# Patient Record
Sex: Female | Born: 1990 | Race: Black or African American | Hispanic: No | Marital: Single | Smoking: Never smoker
Health system: Southern US, Community
[De-identification: ages and names within clinical notes are randomized; demographics above are authoritative.]

## PROBLEM LIST (undated history)

## (undated) DIAGNOSIS — N760 Acute vaginitis: Secondary | ICD-10-CM

## (undated) DIAGNOSIS — B9689 Other specified bacterial agents as the cause of diseases classified elsewhere: Secondary | ICD-10-CM

## (undated) DIAGNOSIS — Z8619 Personal history of other infectious and parasitic diseases: Secondary | ICD-10-CM

## (undated) DIAGNOSIS — J302 Other seasonal allergic rhinitis: Secondary | ICD-10-CM

## (undated) HISTORY — PX: WISDOM TOOTH EXTRACTION: SHX21

---

## 2014-01-20 DIAGNOSIS — G43909 Migraine, unspecified, not intractable, without status migrainosus: Secondary | ICD-10-CM | POA: Insufficient documentation

## 2014-01-20 DIAGNOSIS — N63 Unspecified lump in unspecified breast: Secondary | ICD-10-CM | POA: Insufficient documentation

## 2014-01-20 DIAGNOSIS — R011 Cardiac murmur, unspecified: Secondary | ICD-10-CM | POA: Insufficient documentation

## 2014-06-13 DIAGNOSIS — O1490 Unspecified pre-eclampsia, unspecified trimester: Secondary | ICD-10-CM

## 2017-05-28 DIAGNOSIS — B009 Herpesviral infection, unspecified: Secondary | ICD-10-CM | POA: Insufficient documentation

## 2017-08-08 DIAGNOSIS — F32A Depression, unspecified: Secondary | ICD-10-CM | POA: Insufficient documentation

## 2017-08-08 DIAGNOSIS — R7303 Prediabetes: Secondary | ICD-10-CM | POA: Insufficient documentation

## 2017-08-08 DIAGNOSIS — F329 Major depressive disorder, single episode, unspecified: Secondary | ICD-10-CM | POA: Insufficient documentation

## 2018-03-06 DIAGNOSIS — M25512 Pain in left shoulder: Secondary | ICD-10-CM | POA: Insufficient documentation

## 2018-10-24 DIAGNOSIS — F3281 Premenstrual dysphoric disorder: Secondary | ICD-10-CM | POA: Insufficient documentation

## 2018-10-30 ENCOUNTER — Telehealth: Payer: Self-pay | Admitting: Obstetrics & Gynecology

## 2018-10-30 NOTE — Telephone Encounter (Signed)
[facility] referring for Birth control consult. Called and left voicemail for patient to call back to be schedule

## 2018-10-31 NOTE — Telephone Encounter (Signed)
Called and left voice mail for patient to call back to be schedule °

## 2018-11-14 ENCOUNTER — Encounter: Payer: Medicaid Other | Admitting: Maternal Newborn

## 2018-11-19 ENCOUNTER — Ambulatory Visit (INDEPENDENT_AMBULATORY_CARE_PROVIDER_SITE_OTHER): Payer: Medicaid Other | Admitting: Maternal Newborn

## 2018-11-19 ENCOUNTER — Encounter: Payer: Self-pay | Admitting: Maternal Newborn

## 2018-11-19 ENCOUNTER — Other Ambulatory Visit: Payer: Self-pay

## 2018-11-19 ENCOUNTER — Encounter: Payer: Self-pay | Admitting: Emergency Medicine

## 2018-11-19 ENCOUNTER — Ambulatory Visit
Admission: EM | Admit: 2018-11-19 | Discharge: 2018-11-19 | Disposition: A | Discharge status: Home / Self Care | Payer: Medicaid Other | Attending: Family Medicine | Admitting: Family Medicine

## 2018-11-19 VITALS — BP 110/70 | HR 68 | Ht 62.0 in | Wt 135.0 lb

## 2018-11-19 DIAGNOSIS — Z3009 Encounter for other general counseling and advice on contraception: Secondary | ICD-10-CM

## 2018-11-19 DIAGNOSIS — Z30430 Encounter for insertion of intrauterine contraceptive device: Secondary | ICD-10-CM | POA: Diagnosis not present

## 2018-11-19 DIAGNOSIS — J029 Acute pharyngitis, unspecified: Secondary | ICD-10-CM | POA: Diagnosis present

## 2018-11-19 DIAGNOSIS — Z3202 Encounter for pregnancy test, result negative: Secondary | ICD-10-CM

## 2018-11-19 HISTORY — DX: Other seasonal allergic rhinitis: J30.2

## 2018-11-19 LAB — RAPID STREP SCREEN (MED CTR MEBANE ONLY): Streptococcus, Group A Screen (Direct): NEGATIVE

## 2018-11-19 LAB — POCT URINE PREGNANCY: Preg Test, Ur: NEGATIVE

## 2018-11-19 MED ORDER — AMOXICILLIN 500 MG PO TABS: 500.0000 mg | ORAL_TABLET | Freq: Two times a day (BID) | ORAL | 0 refills | Status: DC

## 2018-11-19 NOTE — Discharge Instructions (Signed)
OTC Ibuprofen as needed for pain.  Antibiotic as prescribed while awaiting strep culture.  Take care  Dr. Lacinda Axon

## 2018-11-19 NOTE — Progress Notes (Signed)
Obstetrics & Gynecology Office Visit   Chief Complaint:  Chief Complaint  Patient presents with  . Referral    [facility] conference; wants to try to stop cycle d/t PMDD    History of Present Illness: Cathy Ortiz is here to start birth control that will lessen and hopefully stop her menstrual cycles because she has a history of PMDD. She has had a Liletta IUD in the past, but had regular menses with that device. She is currently not using birth control. She recently completed a regular cycke.  Review of Systems  Constitutional: Negative.   HENT: Negative.   Eyes: Negative.   Respiratory: Negative.   Cardiovascular: Negative.   Gastrointestinal: Negative.   Genitourinary:       Pain during intercourse and vaginal discharge  Musculoskeletal: Negative.   Skin: Negative.   Neurological: Negative.   Endo/Heme/Allergies: Positive for environmental allergies.       Hot flashes  Psychiatric/Behavioral: Negative.     Past Medical History:  History reviewed. No pertinent past medical history.  Past Surgical History:  Past Surgical History:  Procedure Laterality Date  . WISDOM TOOTH EXTRACTION  2016/2017   ALL FOUR    Gynecologic History: No LMP recorded.  Obstetric History: [id number]  Family History:  Family History  Problem Relation Age of Onset  . Cancer Maternal Grandmother 62       LUNG  . Breast cancer Paternal Grandmother 76    Social History:  Social History   Socioeconomic History  . Marital status: Single    Spouse name: Not on file  . Number of children: Not on file  . Years of education: Not on file  . Highest education level: Not on file  Occupational History  . Not on file  Social Needs  . Financial resource strain: Not on file  . Food insecurity    Worry: Not on file    Inability: Not on file  . Transportation needs    Medical: Not on file    Non-medical: Not on file  Tobacco Use  . Smoking status: Never Smoker  . Smokeless tobacco: Never Used   Substance and Sexual Activity  . Alcohol use: Never    Frequency: Never  . Drug use: Never  . Sexual activity: Yes    Birth control/protection: I.U.D.  Lifestyle  . Physical activity    Days per week: Not on file    Minutes per session: Not on file  . Stress: Not on file  Relationships  . Social Musician on phone: Not on file    Gets together: Not on file    Attends religious service: Not on file    Active member of club or organization: Not on file    Attends meetings of clubs or organizations: Not on file    Relationship status: Not on file  . Intimate partner violence    Fear of current or ex partner: Not on file    Emotionally abused: Not on file    Physically abused: Not on file    Forced sexual activity: Not on file  Other Topics Concern  . Not on file  Social History Narrative  . Not on file    Allergies:  Not on File  Medications: Prior to Admission medications   Medication Sig Start Date End Date Taking? Authorizing Provider  acyclovir (ZOVIRAX) 400 MG tablet Take 1 tablet by mouth as needed.   Yes [provider]  Cetirizine HCl (ZYRTEC ALLERGY)  10 MG CAPS Take 1 capsule by mouth daily.   Yes [provider]  Cholecalciferol (VITAMIN D3) 10 MCG (400 UNIT) CHEW Chew 1 tablet by mouth daily.   Yes [provider]  fluticasone (FLONASE) 50 MCG/ACT nasal spray Place 1 spray into the nose 2 (two) times daily. 10/24/18 10/24/19 Yes [provider]  levonorgestrel (MIRENA) 20 MCG/24HR IUD 1 each by Intrauterine route once.   Yes [provider]  Multiple Vitamin (MULTI-VITAMIN) tablet Take 1 tablet by mouth daily.   Yes [provider]    Physical Exam Vitals:  Vitals:   11/19/18 1105  BP: 110/70  Pulse: 68   No LMP recorded.  General: NAD HEENT: normocephalic, anicteric Genitourinary:  External: Normal external female genitalia.  Normal urethral  meatus, normal Bartholin's and Skene's glands.     Vagina: Normal vaginal mucosa, no evidence of prolapse.    Cervix: Grossly normal in appearance, no bleeding  Uterus: Non-enlarged, mobile, normal contour.  No CMT  Rectal: deferred  Lymphatic: no evidence of inguinal lymphadenopathy Extremities: no edema, erythema, or tenderness Neurologic: Grossly intact Psychiatric: mood appropriate, affect full  Assessment: 28 y.o. [id number] here for Mirena IUD placement  Plan: Problem List Items Addressed This Visit    None    Visit Diagnoses    Encounter for IUD insertion    -  Primary   Urine pregnancy test negative       Relevant Orders   POCT urine pregnancy (Completed)   Counseling for initiation of birth control method         Discussed all methods of birth control that may prolong the interval between menses or cause amenorrhea. She has had problems in the past with DepoProvera and estrogen containing continuous cycle pills, and prefers not to try the NuvaRing. She would like to try the Mirena IUD. We discussed that she may not have cessation of cycles with this method, and may have an irregular bleeding pattern for several months after insertion. She desires to proceed with insertion today.  Avel Sensor, CNM 11/19/2018  12:23 PM

## 2018-11-19 NOTE — Progress Notes (Signed)
    GYNECOLOGY OFFICE PROCEDURE NOTE  Cathy Ortiz is a 28 y.o. [id number] here for a Mirena IUD insertion. No GYN concerns. The indication for her IUD is contraception/cycle control.  IUD Insertion Procedure Note Patient identified, informed consent performed, consent signed.   Discussed risks of irregular bleeding, cramping, infection, malpositioning, expulsion or uterine perforation of the IUD (1:1000 placements)  which may require further procedure such as laparoscopy. Time out was performed.  Urine pregnancy test negative.  Speculum placed in the vagina.  Cervix visualized. Cleaned with Betadine x 3.  Grasped anteriorly with a single tooth tenaculum.  Uterus sounded to 9 cm. IUD placed per manufacturer's recommendations.  Strings trimmed to 3 cm. Tenaculum was removed, good hemostasis noted.  Patient tolerated procedure well.   Patient was given post-procedure instructions. Follow up in 6 weeks for IUD check.  Avel Sensor, CNM [facility], [facility]

## 2018-11-19 NOTE — ED Provider Notes (Signed)
[facility]    CSN: [id number] Arrival date & time: 11/19/18  1210  History   Chief Complaint Chief Complaint  Patient presents with  . Sore Throat   HPI  28 year old female presents with sore throat.  Patient reports sore throat since yesterday.  Patient reports that it is mildly painful.  She believes that she has white exudates on her tonsils.  She feels like her lymph nodes are swollen.  No documented fever at home.  She has taken naproxen without relief.  No known inciting factor.  Denies fever, cough, congestion, headache, body ache, loss of taste or smell.  No known exacerbating factors.  No other associated symptoms.  No other complaints.  PMH, Surgical Hx, Family Hx, Social History reviewed and updated as below.  PMH: Abnormal Pap smear of cervix    BV (bacterial vaginosis)    Anxiety    Migraine  PER PT. DIAGNOSED  Heart murmur, unspecified    Dizziness      Past Surgical History:  Procedure Laterality Date  . WISDOM TOOTH EXTRACTION  2016/2017   ALL FOUR    OB History    Gravida  2   Para  2   Term  2   Preterm  0   AB  0   Living  2     SAB  0   TAB  0   Ectopic  0   Multiple  0   Live Births  2           Home Medications    Prior to Admission medications   Medication Sig Start Date End Date Taking? Authorizing Provider  acyclovir (ZOVIRAX) 400 MG tablet Take 1 tablet by mouth as needed.   Yes [provider]  Cetirizine HCl (ZYRTEC ALLERGY) 10 MG CAPS Take 1 capsule by mouth daily.   Yes [provider]  Cholecalciferol (VITAMIN D3) 10 MCG (400 UNIT) CHEW Chew 1 tablet by mouth daily.   Yes [provider]  fluticasone (FLONASE) 50 MCG/ACT nasal spray Place 1 spray into the nose 2 (two) times daily. 10/24/18 10/24/19 Yes [provider]  levonorgestrel (MIRENA) 20 MCG/24HR IUD 1 each by Intrauterine route once.   Yes [provider]  Multiple Vitamin (MULTI-VITAMIN)  tablet Take 1 tablet by mouth daily.   Yes [provider]  amoxicillin (AMOXIL) 500 MG tablet Take 1 tablet (500 mg total) by mouth 2 (two) times daily. 11/19/18   Coral Spikes, DO    Family History Family History  Problem Relation Age of Onset  . Healthy Mother   . Healthy Father   . Cancer Maternal Grandmother 50       LUNG  . Breast cancer Paternal Grandmother 37    Social History Social History   Tobacco Use  . Smoking status: Never Smoker  . Smokeless tobacco: Never Used  Substance Use Topics  . Alcohol use: Never    Frequency: Never  . Drug use: Never     Allergies   Patient has no known allergies.   Review of Systems Review of Systems  Constitutional: Negative.   HENT: Positive for sore throat.    Physical Exam Triage Vital Signs ED Triage Vitals  Enc Vitals Group     BP 11/19/18 1227 117/73     Pulse Rate 11/19/18 1227 64     Resp 11/19/18 1227 16     Temp 11/19/18 1227 99.4 F (37.4 C)     Temp Source  11/19/18 1227 Oral     SpO2 11/19/18 1227 100 %     Weight 11/19/18 1228 135 lb (61.2 kg)     Height 11/19/18 1228 5\' 2"  (1.575 m)     Head Circumference --      Peak Flow --      Pain Score 11/19/18 1228 2     Pain Loc --      Pain Edu? --      Excl. in GC? --    Updated Vital Signs BP 117/73 (BP Location: Left Arm)   Pulse 64   Temp 99.4 F (37.4 C) (Oral)   Resp 16   Ht 5\' 2"  (1.575 m)   Wt 61.2 kg   SpO2 100%   BMI 24.69 kg/m   Visual Acuity Right Eye Distance:   Left Eye Distance:   Bilateral Distance:    Right Eye Near:   Left Eye Near:    Bilateral Near:     Physical Exam Vitals signs and nursing note reviewed.  Constitutional:      Appearance: Normal appearance.  HENT:     Head: Normocephalic and atraumatic.     Mouth/Throat:     Comments: Oropharynx with mild erythema.  Mild tonsillar exudates. Eyes:     General:        Right eye: No discharge.        Left eye: No discharge.     Conjunctiva/sclera:  Conjunctivae normal.  Neck:     Musculoskeletal: Neck supple.     Comments: No appreciable lymphadenopathy. Cardiovascular:     Rate and Rhythm: Normal rate and regular rhythm.     Heart sounds: No murmur.  Pulmonary:     Effort: Pulmonary effort is normal.     Breath sounds: Normal breath sounds. No wheezing, rhonchi or rales.  Neurological:     Mental Status: She is alert.  Psychiatric:        Mood and Affect: Mood normal.        Behavior: Behavior normal.     UC Treatments / Results  Labs (all labs ordered are listed, but only abnormal results are displayed) Labs Reviewed  RAPID STREP SCREEN ([facility] ONLY)  CULTURE, GROUP A STREP [facility])    EKG   Radiology No results found.  Procedures Procedures (including critical care time)  Medications Ordered in UC Medications - No data to display  Initial Impression / Assessment and Plan / UC Course  I have reviewed the triage vital signs and the nursing notes.  Pertinent labs & imaging results that were available during my care of the patient were reviewed by me and considered in my medical decision making (see chart for details).    28 year old female presents with pharyngitis.  Rapid strep negative.  Placing on empiric amoxicillin while awaiting culture.  Final Clinical Impressions(s) / UC Diagnoses   Final diagnoses:  Pharyngitis, unspecified etiology     Discharge Instructions     OTC Ibuprofen as needed for pain.  Antibiotic as prescribed while awaiting strep culture.  Take care  [facility]    ED Prescriptions    Medication Sig Dispense Auth. Provider   amoxicillin (AMOXIL) 500 MG tablet Take 1 tablet (500 mg total) by mouth 2 (two) times daily. 20 tablet 26, DO     PDMP not reviewed this encounter.   Adriana Simas, Tommie Sams 11/19/18 1333

## 2018-11-19 NOTE — ED Triage Notes (Signed)
Patient in today c/o sore throat, white patches on tonsils and swollen lymph nodes since yesterday morning. Patient denies fever, cough, congestion, loss of taste or smell, headache or body aches.

## 2018-11-21 LAB — CULTURE, GROUP A STREP (THRC)

## 2018-12-09 ENCOUNTER — Ambulatory Visit: Payer: Medicaid Other | Admitting: Obstetrics and Gynecology

## 2018-12-09 NOTE — Progress Notes (Deleted)
   No chief complaint on file.    History of Present Illness:  Cathy Ortiz is a 28 y.o. that had a {IUD:23561} IUD placed approximately {NUMBERS:20191} {MONTH/YR:310907} ago. Since that time, she denies dyspareunia, pelvic pain, non-menstrual bleeding, vaginal d/c, heavy bleeding.    There were no vitals taken for this visit.  Pelvic exam:  Two IUD strings {DESC; PRESENT/ABSENT:17923::"present"} seen coming from the cervical os. EGBUS, vaginal vault and cervix: within normal limits  IUD Removal Strings of IUD identified and grasped.  IUD removed without problem with ring forceps.  Pt tolerated this well.  IUD noted to be intact.  Assessment:  No diagnosis found.    Plan: IUD removed and plan for contraception is {PLAN CONTRACEPTION:313102}. She was amenable to this plan.  Tracia Lacomb B. Janah Mcculloh, PA-C 12/09/2018 2:48 PM

## 2019-02-04 NOTE — Progress Notes (Signed)
   Chief Complaint  Patient presents with  . Contraception    IUD removal due to schiatic pain, not sure about new BC     History of Present Illness:  Cathy Ortiz is a 29 y.o. that had a Mirena IUD placed approximately 3 months ago for PMDD sx. Since that time, she has had almost daily bleeding and cramping. Dysmen occurs as LT sciatic pain. Had liletta in past with same sciatic sx; resolved after IUD removal. Pt not sex active. Declines other BC for now.   BP 108/70   Ht 5\' 2"  (1.575 m)   Wt 130 lb (59 kg)   BMI 23.78 kg/m   Pelvic exam:  Two IUD strings present seen coming from the cervical os. EGBUS, vaginal vault and cervix: within normal limits  IUD Removal Strings of IUD identified and grasped.  IUD removed without problem with ring forceps.  Pt tolerated this well.  IUD noted to be intact.  Assessment:  Encounter for IUD removal    Plan: IUD removed and plan for contraception is abstinence. She was amenable to this plan.  Littleton Haub B. Giavonni Cizek, PA-C 02/05/2019 9:37 AM

## 2019-02-05 ENCOUNTER — Ambulatory Visit (INDEPENDENT_AMBULATORY_CARE_PROVIDER_SITE_OTHER): Payer: Medicaid Other | Admitting: Obstetrics and Gynecology

## 2019-02-05 ENCOUNTER — Other Ambulatory Visit: Payer: Self-pay

## 2019-02-05 ENCOUNTER — Encounter: Payer: Self-pay | Admitting: Obstetrics and Gynecology

## 2019-02-05 VITALS — BP 108/70 | Ht 62.0 in | Wt 130.0 lb

## 2019-02-05 DIAGNOSIS — Z30432 Encounter for removal of intrauterine contraceptive device: Secondary | ICD-10-CM

## 2019-02-05 NOTE — Patient Instructions (Signed)
I value your feedback and entrusting us with your care. If you get a [facility] patient survey, I would appreciate you taking the time to let us know about your experience today. Thank you!  As of January 02, 2019, your lab results will be released to your MyChart immediately, before I even have a chance to see them. Please give me time to review them and contact you if there are any abnormalities. Thank you for your patience.  

## 2019-03-09 ENCOUNTER — Other Ambulatory Visit: Payer: Self-pay

## 2019-03-09 ENCOUNTER — Ambulatory Visit
Admission: EM | Admit: 2019-03-09 | Discharge: 2019-03-09 | Disposition: A | Discharge status: Home / Self Care | Payer: Medicaid Other | Attending: Urgent Care | Admitting: Urgent Care

## 2019-03-09 DIAGNOSIS — Z113 Encounter for screening for infections with a predominantly sexual mode of transmission: Secondary | ICD-10-CM | POA: Insufficient documentation

## 2019-03-09 DIAGNOSIS — N760 Acute vaginitis: Secondary | ICD-10-CM | POA: Diagnosis present

## 2019-03-09 DIAGNOSIS — N898 Other specified noninflammatory disorders of vagina: Secondary | ICD-10-CM | POA: Insufficient documentation

## 2019-03-09 DIAGNOSIS — B9689 Other specified bacterial agents as the cause of diseases classified elsewhere: Secondary | ICD-10-CM | POA: Diagnosis present

## 2019-03-09 HISTORY — DX: Other specified bacterial agents as the cause of diseases classified elsewhere: B96.89

## 2019-03-09 HISTORY — DX: Acute vaginitis: N76.0

## 2019-03-09 LAB — URINALYSIS, COMPLETE (UACMP) WITH MICROSCOPIC
Bacteria, UA: NONE SEEN
Glucose, UA: NEGATIVE mg/dL
Hgb urine dipstick: NEGATIVE
Leukocytes,Ua: NEGATIVE
Nitrite: NEGATIVE
Protein, ur: NEGATIVE mg/dL
Specific Gravity, Urine: >1.03 — ABNORMAL HIGH (ref 1.005–1.030)
pH: 6 (ref 5.0–8.0)

## 2019-03-09 LAB — WET PREP, GENITAL
Sperm: NONE SEEN
Trich, Wet Prep: NONE SEEN
Yeast Wet Prep HPF POC: NONE SEEN

## 2019-03-09 LAB — CHLAMYDIA/NGC RT PCR (ARMC ONLY)
Chlamydia Tr: DETECTED — AB
N gonorrhoeae: NOT DETECTED

## 2019-03-09 LAB — PREGNANCY, URINE: Preg Test, Ur: NEGATIVE

## 2019-03-09 MED ORDER — METRONIDAZOLE 500 MG PO TABS: 500.0000 mg | ORAL_TABLET | Freq: Two times a day (BID) | ORAL | 0 refills | Status: DC

## 2019-03-09 NOTE — Discharge Instructions (Signed)
It was very nice seeing you today in clinic. Thank you for entrusting me with your care.   Wet prep (+) for BV. Increase fluid intake.Take medication as prescribed. Complete the entire course of antibiotics even if feeling better. AVOID all alcohol while medication in order to prevent a reaction that will result in significant nausea and vomiting.   Make arrangements to follow up with your regular doctor in 1 week for re-evaluation if not improving. If your symptoms/condition worsens, please seek follow up care either here or in the ER. Please remember, our [facility] providers are "right here with you" when you need [place].   Again, it was my pleasure to take care of you today. Thank you for choosing our clinic. I hope that you start to feel better quickly.   Quentin Mulling, MSN, APRN, FNP-C, CEN Advanced Practice Provider [facility]

## 2019-03-09 NOTE — ED Provider Notes (Signed)
[place]   Name: Cathy Ortiz DOB: Jun 06, 1990 MRN: [id number] CSN: [id number] PCP: Patient, No Pcp Per  Arrival date and time:  03/09/19 1122  Chief Complaint:  Vaginal Discharge   NOTE: Prior to seeing the patient today, I have reviewed the triage nursing documentation and vital signs. Clinical staff has updated patient's PMH/PSHx, current medication list, and drug allergies/intolerances to ensure comprehensive history available to assist in medical decision making.   History:   HPI: Cathy Ortiz is a 29 y.o. female who presents today with complaints of vaginal discharge that began approximately 2 days ago. Discharge is reported to be yellow in color. Patient describes the discharge has having a fishy odor. She denies any associated vaginal/pelvic pain. She has not appreciated any bleeding. Patient has not experienced any concurrent urinary symptoms; no dysuria, frequency, urgency, or gross hematuria. Patient denies any associated nausea, vomiting, fever/chills, or pain in her lower back, flank area, or abdomen. Patient advises that she does not have a significant history for STIs in the past. She denies any vaginal pain, bleeding, or discharge. Patient's last menstrual period was 02/19/2019 (approximate). There are no concerns that she is currently pregnant.   Past Medical History:  Diagnosis Date  . BV (bacterial vaginosis)    Recurrent  . Seasonal allergies     Past Surgical History:  Procedure Laterality Date  . WISDOM TOOTH EXTRACTION  2016/2017   ALL FOUR    Family History  Problem Relation Age of Onset  . Healthy Mother   . Healthy Father   . Cancer Maternal Grandmother 2       LUNG  . Breast cancer Paternal Grandmother 80    Social History   Tobacco Use  . Smoking status: Never Smoker  . Smokeless tobacco: Never Used  Substance Use Topics  . Alcohol use: Never  . Drug use: Never    There are no problems to display for this patient.   Home  Medications:    Current Meds  Medication Sig  . Multiple Vitamin (MULTI-VITAMIN) tablet Take 1 tablet by mouth daily.    Allergies:   Patient has no known allergies.  Review of Systems (ROS): Review of Systems  Constitutional: Negative for chills and fever.  Respiratory: Negative for cough and shortness of breath.   Cardiovascular: Negative for chest pain and palpitations.  Gastrointestinal: Negative for abdominal pain, nausea and vomiting.  Genitourinary: Positive for vaginal discharge. Negative for decreased urine volume, dysuria, frequency, hematuria, pelvic pain, urgency, vaginal bleeding and vaginal pain.  Musculoskeletal: Negative for back pain.  Skin: Negative for color change, pallor and rash.  Neurological: Negative for dizziness, syncope, weakness and headaches.  All other systems reviewed and are negative.    Vital Signs: Today's Vitals   03/09/19 1148 03/09/19 1149 03/09/19 1151 03/09/19 1238  BP:   110/66   Pulse:   65   Temp:   98.3 F (36.8 C)   TempSrc:   Oral   SpO2:   100%   Weight:  130 lb (59 kg)    Height:  5\' 1"  (1.549 m)    PainSc: 0-No pain   0-No pain    Physical Exam: Physical Exam  Constitutional: She is oriented to person, place, and time and well-developed, well-nourished, and in no distress.  HENT:  Head: Normocephalic and atraumatic.  Eyes: Pupils are equal, round, and reactive to light.  Cardiovascular: Normal rate and intact distal pulses.  Pulmonary/Chest: Effort normal. No respiratory distress.  Abdominal: Soft.  Normal appearance. She exhibits no distension. There is no abdominal tenderness. There is no CVA tenderness.  Genitourinary:    Genitourinary Comments: Exam deferred. No vaginal/pelvic pain or bleeding. Patient is not currently pregnant. She has elected to self collect specimen swab for wet prep and DNA probe for GC.   Neurological: She is alert and oriented to person, place, and time. Gait normal.  Skin: Skin is warm and  dry. No rash noted. She is not diaphoretic.  Psychiatric: Mood, memory, affect and judgment normal.  Nursing note and vitals reviewed.   Urgent Care Treatments / Results:   Orders Placed This Encounter  Procedures  . Wet prep, genital  . Chlamydia/NGC rt PCR ([place] only)  . Urinalysis, Complete w Microscopic  . Pregnancy, urine    LABS: PLEASE NOTE: all labs that were ordered this encounter are listed, however only abnormal results are displayed. Labs Reviewed  WET PREP, GENITAL - Abnormal; Notable for the following components:      Result Value   Clue Cells Wet Prep HPF POC PRESENT (*)    WBC, Wet Prep HPF POC MANY (*)    All other components within normal limits  URINALYSIS, COMPLETE (UACMP) WITH MICROSCOPIC - Abnormal; Notable for the following components:   Color, Urine AMBER (*)    APPearance HAZY (*)    Specific Gravity, Urine >1.030 (*)    Bilirubin Urine SMALL (*)    Ketones, ur TRACE (*)    All other components within normal limits  CHLAMYDIA/NGC RT PCR ([facility] ONLY)  PREGNANCY, URINE    EKG: -None  RADIOLOGY: No results found.  PROCEDURES: Procedures  MEDICATIONS RECEIVED THIS VISIT: Medications - No data to display  PERTINENT CLINICAL COURSE NOTES/UPDATES:   Initial Impression / Assessment and Plan / Urgent Care Course:  Pertinent labs & imaging results that were available during my care of the patient were personally reviewed by me and considered in my medical decision making (see lab/imaging section of note for values and interpretations).  Cathy Ortiz is a 29 y.o. female who presents to [facility] today with complaints of Vaginal Discharge  Patient is well appearing overall in clinic today. She does not appear to be in any acute distress. Presenting symptoms (see HPI) and exam as documented above. Patient with symptoms x 2 days. PMH (+) for recurrent BV. Will evaluate and treat as follows:   UA (-) for infection.   Urine HCG (-)  for pregnancy.    DNA probe for GC collected and sent to the lab for testing. Patient aware that she will be contacted with further treatment directives should her testing result as positive.   Wet prep swab did not reveal any candida or trichomonas, however the sample was (+) for clue cells, which is consistent with bacterial vaginosis (BV) infection. Will treat with a 7 day course of oral metronidazole. Patient encouraged to complete the entire course of antibiotics even if she begins to feel better. Educated on need to avoid all ETOH while she is taking this medication in order to prevent a disulfiram like reaction that will result in significant nausea and vomiting. Patient encouraged to increase her fluid intake as much as possible. Discussed that water is always best to flush the urinary tract and prevent development of a urinary tract infection while on treatment for the BV.  Discussed follow up with primary care physician in 1 week for re-evaluation. I have reviewed the follow up and strict return precautions for any new  or worsening symptoms. Patient is aware of symptoms that would be deemed urgent/emergent, and would thus require further evaluation either here or in the emergency department. At the time of discharge, she verbalized understanding and consent with the discharge plan as it was reviewed with her. All questions were fielded by provider and/or clinic staff prior to patient discharge.    Final Clinical Impressions / Urgent Care Diagnoses:   Final diagnoses:  BV (bacterial vaginosis)  Vaginal discharge  Screen for STD (sexually transmitted disease)    New Prescriptions:  [place] Controlled Substance Registry consulted? Not Applicable  Meds ordered this encounter  Medications  . metroNIDAZOLE (FLAGYL) 500 MG tablet    Sig: Take 1 tablet (500 mg total) by mouth 2 (two) times daily.    Dispense:  14 tablet    Refill:  0    Recommended Follow up Care:  Patient encouraged to  follow up with the following provider within the specified time frame, or sooner as dictated by the severity of her symptoms. As always, she was instructed that for any urgent/emergent care needs, she should seek care either here or in the emergency department for more immediate evaluation.  Follow-up Information    PCP In 1 week.   Why: General reassessment of symptoms if not improving        NOTE: This note was prepared using Scientist, clinical (histocompatibility and immunogenetics) along with smaller Lobbyist. Despite my best ability to proofread, there is the potential that transcriptional errors may still occur from this process, and are completely unintentional.    Verlee Monte, NP 03/09/19 1308

## 2019-03-09 NOTE — ED Triage Notes (Signed)
Pt presents with c/o possible BV for the past 2 days. She reports vaginal odor and yellow discharge. She denies any pain or urinary symptoms. She denies concern for STI's.

## 2019-03-10 ENCOUNTER — Telehealth: Payer: Self-pay | Admitting: Family Medicine

## 2019-03-10 MED ORDER — AZITHROMYCIN 500 MG PO TABS: 1000.0000 mg | ORAL_TABLET | Freq: Once | ORAL | 0 refills | Status: AC

## 2019-03-10 NOTE — Telephone Encounter (Signed)
Patient notified by CMA of positive chlamydia test result and the rx sent to the pharmacy as per order.

## 2019-06-13 ENCOUNTER — Encounter: Payer: Self-pay | Admitting: Emergency Medicine

## 2019-06-13 ENCOUNTER — Ambulatory Visit
Admission: EM | Admit: 2019-06-13 | Discharge: 2019-06-13 | Disposition: A | Discharge status: Home / Self Care | Payer: Medicaid Other | Attending: Family Medicine | Admitting: Family Medicine

## 2019-06-13 ENCOUNTER — Other Ambulatory Visit: Payer: Self-pay

## 2019-06-13 DIAGNOSIS — J301 Allergic rhinitis due to pollen: Secondary | ICD-10-CM | POA: Diagnosis present

## 2019-06-13 DIAGNOSIS — Z20822 Contact with and (suspected) exposure to covid-19: Secondary | ICD-10-CM

## 2019-06-13 DIAGNOSIS — R0981 Nasal congestion: Secondary | ICD-10-CM | POA: Diagnosis present

## 2019-06-13 HISTORY — DX: Personal history of other infectious and parasitic diseases: Z86.19

## 2019-06-13 MED ORDER — CETIRIZINE-PSEUDOEPHEDRINE ER 5-120 MG PO TB12: 1.0000 | ORAL_TABLET | Freq: Every day | ORAL | 0 refills | Status: DC

## 2019-06-13 NOTE — ED Triage Notes (Signed)
Patient c/o cough, runny nose, and sneezing that started 2 days ago.  Patient denies fevers.

## 2019-06-13 NOTE — Discharge Instructions (Signed)
It was very nice seeing you today in clinic. Thank you for entrusting me with your care.   Make sure you are staying well hydrated. Start allergy medication.   You were tested for SARS-CoV-2 (novel coronavirus) today. Testing is being processed at the main campus of [facility] in [place], and have been taking 12-24 hours to come back. Current recommendations from the the CDC and [place] DHHS require that you remain out of work in order to quarantine at home until negative test results are have been received. In the event that your test results are positive, you will be contacted with further directives. These measures are being implemented out of an abundance of caution to prevent transmission and spread during the current SARS-CoV-2 pandemic.  Make arrangements to follow up with your regular doctor in 1 week for re-evaluation if not improving. If your symptoms/condition worsens, please seek follow up care either here or in the ER. Please remember, our [facility] providers are "right here with you" when you need [place].   Again, it was my pleasure to take care of you today. Thank you for choosing our clinic. I hope that you start to feel better quickly.   Quentin Mulling, MSN, APRN, FNP-C, CEN Advanced Practice Provider [facility]

## 2019-06-14 ENCOUNTER — Encounter: Payer: Self-pay | Admitting: Urgent Care

## 2019-06-14 LAB — SARS CORONAVIRUS 2 (TAT 6-24 HRS): SARS Coronavirus 2: NEGATIVE

## 2019-06-14 NOTE — ED Provider Notes (Signed)
[place]   Name: Cathy Ortiz DOB: 1990/05/08 MRN: [id number] CSN: [id number] PCP: Su Monks, PA  Arrival date and time:  06/13/19 1027  Chief Complaint:  Cough and Nasal Congestion  NOTE: Prior to seeing the patient today, I have reviewed the triage nursing documentation and vital signs. Clinical staff has updated patient's PMH/PSHx, current medication list, and drug allergies/intolerances to ensure comprehensive history available to assist in medical decision making.   History:   HPI: Cathy Ortiz is a 29 y.o. female who presents today with complaints of congestion, rhinorrhea, sneezing, and a minor scratchy throat that started approximately 2 days ago. Patient denies fevers. Cough has been non-productive with no associated shortness of breath or wheezing. PMH (+) for seasonal allergies; takes daily cetirizine. She denies that she has experienced any nausea, vomiting, diarrhea, or abdominal pain. She is eating and drinking well. Patient denies any perceived alterations to her sense of taste or smell. Patient reports that her son had similar symptoms about 2 weeks ago; recovered without issues/treatment. She otherwise denies being in close contact with anyone known to be ill. She has not been tested for SARS-CoV-2 (novel coronavirus) in the past 14 days. Despite her symptoms, patient has not taken any over the counter interventions, beyond the cetirizine, to help improve/relieve her reported symptoms at home.   Past Medical History:  Diagnosis Date  . BV (bacterial vaginosis)    Recurrent  . Seasonal allergies     Past Surgical History:  Procedure Laterality Date  . WISDOM TOOTH EXTRACTION  2016/2017   ALL FOUR    Family History  Problem Relation Age of Onset  . Healthy Mother   . Healthy Father   . Cancer Maternal Grandmother 54       LUNG  . Breast cancer Paternal Grandmother 44    Social History   Tobacco Use  . Smoking status: Never Smoker  . Smokeless  tobacco: Never Used  Substance Use Topics  . Alcohol use: Never  . Drug use: Never    There are no problems to display for this patient.   Home Medications:    Current Meds  Medication Sig  . Multiple Vitamin (MULTI-VITAMIN) tablet Take 1 tablet by mouth daily.    Allergies:   Patient has no known allergies.  Review of Systems (ROS):  Review of systems NEGATIVE unless otherwise noted in narrative H&P section.   Vital Signs: Today's Vitals   06/13/19 1046 06/13/19 1049 06/13/19 1129  BP:  132/67   Pulse:  74   Resp:  14   Temp:  98.6 F (37 C)   TempSrc:  Oral   SpO2:  99%   Weight: 128 lb (58.1 kg)    Height: 5\' 2"  (1.575 m)    PainSc: 0-No pain  0-No pain    Physical Exam: Physical Exam  Constitutional: She is oriented to person, place, and time and well-developed, well-nourished, and in no distress.  HENT:  Head: Normocephalic and atraumatic.  Right Ear: Tympanic membrane is injected. Tympanic membrane is not erythematous and not bulging. A middle ear effusion (mild serous) is present.  Left Ear: Tympanic membrane normal.  Nose: Mucosal edema (mild) and rhinorrhea present. No sinus tenderness.  Mouth/Throat: Uvula is midline and mucous membranes are normal. Posterior oropharyngeal erythema (mild with (+) clear PND) present. No oropharyngeal exudate or posterior oropharyngeal edema.  Eyes: Pupils are equal, round, and reactive to light.  Cardiovascular: Normal rate, regular rhythm, normal heart sounds and intact  distal pulses.  Pulmonary/Chest: Effort normal and breath sounds normal.  Mild cough noted in clinic. No SOB or increased WOB. No distress. Able to speak in complete sentences without difficulties. SPO2 99% on RA.  Neurological: She is alert and oriented to person, place, and time. Gait normal.  Skin: Skin is warm and dry. No rash noted. She is not diaphoretic.  Psychiatric: Mood, memory, affect and judgment normal.  Nursing note and vitals  reviewed.   Urgent Care Treatments / Results:   Orders Placed This Encounter  Procedures  . SARS CORONAVIRUS 2 (TAT 6-24 HRS) Nasopharyngeal Nasopharyngeal Swab    LABS: PLEASE NOTE: all labs that were ordered this encounter are listed, however only abnormal results are displayed. Labs Reviewed  SARS CORONAVIRUS 2 (TAT 6-24 HRS)    EKG: -None  RADIOLOGY: No results found.  PROCEDURES: Procedures  MEDICATIONS RECEIVED THIS VISIT: Medications - No data to display  PERTINENT CLINICAL COURSE NOTES/UPDATES:   Initial Impression / Assessment and Plan / Urgent Care Course:  Pertinent labs & imaging results that were available during my care of the patient were personally reviewed by me and considered in my medical decision making (see lab/imaging section of note for values and interpretations).  Cathy Ortiz is a 29 y.o. female who presents to [facility] today with complaints of Cough and Nasal Congestion  Patient overall well appearing and in no acute distress today in clinic. Presenting symptoms (see HPI) and exam as documented above. She presents with symptoms associated with SARS-CoV-2 (novel coronavirus). Discussed typical symptom constellation. Reviewed potential for infection and need for testing. Patient amenable to being tested. SARS-CoV-2 swab collected by certified clinical staff. Discussed variable turn around times associated with testing, as swabs are being processed at the main campus of [facility] in [place], and have been taking 12-24 hours to come back. She was advised to self quarantine, per [facility] DHHS guidelines, until negative results received. These measures are being implemented out of an abundance of caution to prevent transmission and spread during the current SARS-CoV-2 pandemic.  Exam and symptom constellation consistent with seasonal allergies. Will change to Zyrtec-D. SARS-CoV-2 testing pending. Reviewed supportive care measures; rest, increased  hydration, and PRN use of APAP and/or IBU for discomfort/fever.  Intervention for cough offered, however patient declined citing that her symptoms are mild/controlled.  Discussed follow up with primary care physician in 1 week for re-evaluation. I have reviewed the follow up and strict return precautions for any new or worsening symptoms. Patient is aware of symptoms that would be deemed urgent/emergent, and would thus require further evaluation either here or in the emergency department. At the time of discharge, she verbalized understanding and consent with the discharge plan as it was reviewed with her. All questions were fielded by provider and/or clinic staff prior to patient discharge.    Final Clinical Impressions / Urgent Care Diagnoses:   Final diagnoses:  Seasonal allergic rhinitis due to pollen  Nasal congestion  Encounter for laboratory testing for COVID-19 virus    New Prescriptions:  [place] Controlled Substance Registry consulted? Not Applicable  Meds ordered this encounter  Medications  . cetirizine-pseudoephedrine (ZYRTEC-D) 5-120 MG tablet    Sig: Take 1 tablet by mouth daily.    Dispense:  30 tablet    Refill:  0    Recommended Follow up Care:  Patient encouraged to follow up with the following provider within the specified time frame, or sooner as dictated by the severity of her symptoms.  As always, she was instructed that for any urgent/emergent care needs, she should seek care either here or in the emergency department for more immediate evaluation.  Follow-up Information    Su Monks, PA In 1 week.   Specialty: Physician Assistant Why: General reassessment of symptoms if not improving Contact information: [address] [place] [address] [phone]         NOTE: This note was prepared using Dragon dictation software along with smaller phrase technology. Despite my best ability to proofread, there is the potential that transcriptional errors  may still occur from this process, and are completely unintentional.     Verlee Monte, NP 06/14/19 1916

## 2019-06-25 ENCOUNTER — Ambulatory Visit
Admission: EM | Admit: 2019-06-25 | Discharge: 2019-06-25 | Disposition: A | Discharge status: Home / Self Care | Payer: Medicaid Other | Attending: Family Medicine | Admitting: Family Medicine

## 2019-06-25 ENCOUNTER — Encounter: Payer: Self-pay | Admitting: Emergency Medicine

## 2019-06-25 ENCOUNTER — Other Ambulatory Visit: Payer: Self-pay

## 2019-06-25 DIAGNOSIS — H6502 Acute serous otitis media, left ear: Secondary | ICD-10-CM | POA: Diagnosis not present

## 2019-06-25 MED ORDER — AMOXICILLIN 875 MG PO TABS: 875.0000 mg | ORAL_TABLET | Freq: Two times a day (BID) | ORAL | 0 refills | Status: DC

## 2019-06-25 NOTE — ED Triage Notes (Signed)
Pt c/o left ear fullness, headache. She was seen for allergy symptoms on 06/13/19 and states she was told she had cloudy fluid behind her ear drum. She states it has not gotten better and wants to make sure she does not have an ear infection.

## 2019-06-25 NOTE — ED Provider Notes (Signed)
[facility]    CSN: [id number] Arrival date & time: 06/25/19  0836      History   Chief Complaint Chief Complaint  Patient presents with  . Ear Fullness    HPI Cathy Ortiz is a 29 y.o. female.   29 yo female with a c/o left ear pain and fullness for the past several weeks. States was seen here on 06/13/19 and informed she had some fluid behind the eardrum. States pain has not improved. Denies any drainage, fevers, chills.    Ear Fullness    Past Medical History:  Diagnosis Date  . BV (bacterial vaginosis)    Recurrent  . History of chlamydia   . Seasonal allergies     There are no problems to display for this patient.   Past Surgical History:  Procedure Laterality Date  . WISDOM TOOTH EXTRACTION  2016/2017   ALL FOUR    OB History    Gravida  2   Para  2   Term  2   Preterm  0   AB  0   Living  2     SAB  0   TAB  0   Ectopic  0   Multiple  0   Live Births  2            Home Medications    Prior to Admission medications   Medication Sig Start Date End Date Taking? Authorizing Provider  cetirizine (ZYRTEC) 10 MG tablet Take 10 mg by mouth daily.   Yes [provider]  Multiple Vitamin (MULTI-VITAMIN) tablet Take 1 tablet by mouth daily.   Yes [provider]  amoxicillin (AMOXIL) 875 MG tablet Take 1 tablet (875 mg total) by mouth 2 (two) times daily. 06/25/19   Payton Mccallum, MD  cetirizine-pseudoephedrine (ZYRTEC-D) 5-120 MG tablet Take 1 tablet by mouth daily. 06/13/19   Verlee Monte, NP  fluticasone (FLONASE) 50 MCG/ACT nasal spray Place 1 spray into the nose 2 (two) times daily. 10/24/18 03/09/19  [provider]  levonorgestrel (MIRENA) 20 MCG/24HR IUD 1 each by Intrauterine route once.  03/09/19  [provider]    Family History Family History  Problem Relation Age of Onset  . Healthy Mother   . Healthy Father   . Cancer Maternal Grandmother 6       LUNG  . Breast cancer  Paternal Grandmother 2    Social History Social History   Tobacco Use  . Smoking status: Never Smoker  . Smokeless tobacco: Never Used  Substance Use Topics  . Alcohol use: Never  . Drug use: Never     Allergies   Patient has no known allergies.   Review of Systems Review of Systems   Physical Exam Triage Vital Signs ED Triage Vitals  Enc Vitals Group     BP 06/25/19 0849 125/73     Pulse Rate 06/25/19 0849 69     Resp 06/25/19 0849 18     Temp 06/25/19 0849 98.5 F (36.9 C)     Temp Source 06/25/19 0849 Oral     SpO2 06/25/19 0849 100 %     Weight 06/25/19 0847 128 lb 1.4 oz (58.1 kg)     Height 06/25/19 0847 5\' 2"  (1.575 m)     Head Circumference --      Peak Flow --      Pain Score 06/25/19 0847 0     Pain Loc --      Pain  Edu? --      Excl. in [place]? --    No data found.  Updated Vital Signs BP 125/73 (BP Location: Left Arm)   Pulse 69   Temp 98.5 F (36.9 C) (Oral)   Resp 18   Ht 5\' 2"  (1.575 m)   Wt 58.1 kg   LMP 06/22/2019 (Approximate)   SpO2 100%   BMI 23.43 kg/m   Visual Acuity Right Eye Distance:   Left Eye Distance:   Bilateral Distance:    Right Eye Near:   Left Eye Near:    Bilateral Near:     Physical Exam Vitals and nursing note reviewed.  Constitutional:      General: She is not in acute distress.    Appearance: She is not toxic-appearing or diaphoretic.  HENT:     Right Ear: Tympanic membrane is erythematous.     Left Ear: A middle ear effusion is present. Tympanic membrane is erythematous and bulging.  Cardiovascular:     Rate and Rhythm: Normal rate.  Pulmonary:     Effort: Pulmonary effort is normal. No respiratory distress.  Musculoskeletal:     Cervical back: Neck supple.  Neurological:     Mental Status: She is alert.      UC Treatments / Results  Labs (all labs ordered are listed, but only abnormal results are displayed) Labs Reviewed - No data to display  EKG   Radiology No results  found.  Procedures Procedures (including critical care time)  Medications Ordered in UC Medications - No data to display  Initial Impression / Assessment and Plan / UC Course  I have reviewed the triage vital signs and the nursing notes.  Pertinent labs & imaging results that were available during my care of the patient were reviewed by me and considered in my medical decision making (see chart for details).      Final Clinical Impressions(s) / UC Diagnoses   Final diagnoses:  Acute serous otitis media of left ear, recurrence not specified    ED Prescriptions    Medication Sig Dispense Auth. Provider   amoxicillin (AMOXIL) 875 MG tablet Take 1 tablet (875 mg total) by mouth 2 (two) times daily. 20 tablet Norval Gable, MD      1. diagnosis reviewed with patient 2. rx as per orders above; reviewed possible side effects, interactions, risks and benefits  3. Recommend supportive treatment with rest, otc meds prn  4. Follow-up prn if symptoms worsen or don't improve   PDMP not reviewed this encounter.   Norval Gable, MD 06/25/19 [phone]

## 2019-07-27 ENCOUNTER — Other Ambulatory Visit: Payer: Self-pay

## 2019-07-27 ENCOUNTER — Ambulatory Visit
Admission: EM | Admit: 2019-07-27 | Discharge: 2019-07-27 | Disposition: A | Discharge status: Home / Self Care | Payer: Medicaid Other | Attending: Family Medicine | Admitting: Family Medicine

## 2019-07-27 DIAGNOSIS — N390 Urinary tract infection, site not specified: Secondary | ICD-10-CM

## 2019-07-27 LAB — URINALYSIS, COMPLETE (UACMP) WITH MICROSCOPIC
Bilirubin Urine: NEGATIVE
Glucose, UA: NEGATIVE mg/dL
Ketones, ur: NEGATIVE mg/dL
Nitrite: NEGATIVE
Protein, ur: NEGATIVE mg/dL
Specific Gravity, Urine: <1.005 — ABNORMAL LOW (ref 1.005–1.030)
pH: 5.5 (ref 5.0–8.0)

## 2019-07-27 MED ORDER — CEPHALEXIN 500 MG PO CAPS: 500.0000 mg | ORAL_CAPSULE | Freq: Two times a day (BID) | ORAL | 0 refills | Status: DC

## 2019-07-27 NOTE — ED Provider Notes (Signed)
[facility]    CSN: [id number] Arrival date & time: 07/27/19  1101      History   Chief Complaint Chief Complaint  Patient presents with  . Dysuria    HPI Cathy Ortiz is a 29 y.o. female.   29 yo female with a c/o discomfort in the bladder area with urination and strong smelling urine since this morning. Denies any fevers, chills, abdominal/;pelvic pain, vomiting, hematuria.    Dysuria   Past Medical History:  Diagnosis Date  . BV (bacterial vaginosis)    Recurrent  . History of chlamydia   . Seasonal allergies     There are no problems to display for this patient.   Past Surgical History:  Procedure Laterality Date  . WISDOM TOOTH EXTRACTION  2016/2017   ALL FOUR    OB History    Gravida  2   Para  2   Term  2   Preterm  0   AB  0   Living  2     SAB  0   TAB  0   Ectopic  0   Multiple  0   Live Births  2            Home Medications    Prior to Admission medications   Medication Sig Start Date End Date Taking? Authorizing Provider  amoxicillin (AMOXIL) 875 MG tablet Take 1 tablet (875 mg total) by mouth 2 (two) times daily. 06/25/19   Payton Mccallum, MD  cephALEXin (KEFLEX) 500 MG capsule Take 1 capsule (500 mg total) by mouth 2 (two) times daily. 07/27/19   Payton Mccallum, MD  cetirizine (ZYRTEC) 10 MG tablet Take 10 mg by mouth daily.    [provider]  cetirizine-pseudoephedrine (ZYRTEC-D) 5-120 MG tablet Take 1 tablet by mouth daily. 06/13/19   Verlee Monte, NP  Multiple Vitamin (MULTI-VITAMIN) tablet Take 1 tablet by mouth daily.    [provider]  fluticasone (FLONASE) 50 MCG/ACT nasal spray Place 1 spray into the nose 2 (two) times daily. 10/24/18 03/09/19  [provider]  levonorgestrel (MIRENA) 20 MCG/24HR IUD 1 each by Intrauterine route once.  03/09/19  [provider]    Family History Family History  Problem Relation Age of Onset  . Healthy Mother   . Healthy Father   .  Cancer Maternal Grandmother 6       LUNG  . Breast cancer Paternal Grandmother 63    Social History Social History   Tobacco Use  . Smoking status: Never Smoker  . Smokeless tobacco: Never Used  Vaping Use  . Vaping Use: Never used  Substance Use Topics  . Alcohol use: Never  . Drug use: Never     Allergies   Patient has no known allergies.   Review of Systems Review of Systems  Genitourinary: Positive for dysuria.     Physical Exam Triage Vital Signs ED Triage Vitals  Enc Vitals Group     BP --      Pulse Rate 07/27/19 1113 73     Resp 07/27/19 1113 15     Temp 07/27/19 1113 98.2 F (36.8 C)     Temp Source 07/27/19 1113 Oral     SpO2 07/27/19 1113 100 %     Weight 07/27/19 1112 127 lb (57.6 kg)     Height 07/27/19 1112 5\' 1"  (1.549 m)     Head Circumference --      Peak Flow --  Pain Score 07/27/19 1112 0     Pain Loc --      Pain Edu? --      Excl. in [place]? --    No data found.  Updated Vital Signs Pulse 73   Temp 98.2 F (36.8 C) (Oral)   Resp 15   Ht 5\' 1"  (1.549 m)   Wt 57.6 kg   LMP 07/17/2019   SpO2 100%   BMI 24.00 kg/m   Visual Acuity Right Eye Distance:   Left Eye Distance:   Bilateral Distance:    Right Eye Near:   Left Eye Near:    Bilateral Near:     Physical Exam Vitals and nursing note reviewed.  Constitutional:      General: She is not in acute distress.    Appearance: Normal appearance. She is not toxic-appearing or diaphoretic.  Abdominal:     General: There is no distension.     Palpations: Abdomen is soft.  Neurological:     Mental Status: She is alert.      UC Treatments / Results  Labs (all labs ordered are listed, but only abnormal results are displayed) Labs Reviewed  URINALYSIS, COMPLETE (UACMP) WITH MICROSCOPIC - Abnormal; Notable for the following components:      Result Value   Specific Gravity, Urine <1.005 (*)    Hgb urine dipstick SMALL (*)    Leukocytes,Ua SMALL (*)    Bacteria, UA FEW  (*)    All other components within normal limits  URINE CULTURE    EKG   Radiology No results found.  Procedures Procedures (including critical care time)  Medications Ordered in UC Medications - No data to display  Initial Impression / Assessment and Plan / UC Course  I have reviewed the triage vital signs and the nursing notes.  Pertinent labs & imaging results that were available during my care of the patient were reviewed by me and considered in my medical decision making (see chart for details).      Final Clinical Impressions(s) / UC Diagnoses   Final diagnoses:  Lower urinary tract infectious disease     Discharge Instructions     Increase water intake    ED Prescriptions    Medication Sig Dispense Auth. Provider   cephALEXin (KEFLEX) 500 MG capsule Take 1 capsule (500 mg total) by mouth 2 (two) times daily. 14 capsule 07/19/2019, MD      1. Lab results and diagnosis reviewed with patient 2. rx as per orders above; reviewed possible side effects, interactions, risks and benefits  3. Recommend supportive treatmen as above 4. Follow-up prn if symptoms worsen or don't improve   PDMP not reviewed this encounter.   Payton Mccallum, MD 07/27/19 1205

## 2019-07-27 NOTE — ED Triage Notes (Signed)
Pt states starting this morning with strong smelling urine and some bladder discomfort. She believes this is the beginning of a UTI

## 2019-07-27 NOTE — Discharge Instructions (Signed)
Increase water intake

## 2019-07-29 LAB — URINE CULTURE
Culture: 20000 — AB
Special Requests: NORMAL

## 2019-08-02 ENCOUNTER — Telehealth: Payer: Self-pay

## 2019-08-02 MED ORDER — FLUCONAZOLE 150 MG PO TABS: 150.0000 mg | ORAL_TABLET | Freq: Every day | ORAL | 0 refills | Status: DC

## 2019-08-02 NOTE — Telephone Encounter (Signed)
Pt called with yeast infection symptoms after taking antibiotic for UTI. Orders rec'd from L. Sharlet Salina, NP and ordered. Pt verbalized understanding of script called into pharmacy ([facility] in [place]).

## 2019-09-03 ENCOUNTER — Other Ambulatory Visit: Payer: Self-pay

## 2019-09-03 ENCOUNTER — Ambulatory Visit: Payer: Medicaid Other | Admitting: Podiatry

## 2019-09-03 ENCOUNTER — Encounter: Payer: Self-pay | Admitting: Podiatry

## 2019-09-03 DIAGNOSIS — M2042 Other hammer toe(s) (acquired), left foot: Secondary | ICD-10-CM | POA: Diagnosis not present

## 2019-09-03 DIAGNOSIS — M2041 Other hammer toe(s) (acquired), right foot: Secondary | ICD-10-CM

## 2019-09-03 NOTE — Progress Notes (Signed)
  Subjective:  Patient ID: Cathy Ortiz, female    DOB: [birth year],  MRN: [id number] HPI Chief Complaint  Patient presents with  . Callouses    Patient presents today for painful corns bilat 5th toes x years.  She says "they get sore when I wear my shoes for a long time and when I wear certain shoes"  She has used otc corn pads in the past with no relief    29 y.o. female presents with the above complaint.   ROS: Denies fever chills nausea vomiting muscle aches pains calf pain back pain chest pain shortness of breath.  Past Medical History:  Diagnosis Date  . BV (bacterial vaginosis)    Recurrent  . History of chlamydia   . Seasonal allergies    Past Surgical History:  Procedure Laterality Date  . WISDOM TOOTH EXTRACTION  2016/2017   ALL FOUR    Current Outpatient Medications:  .  cetirizine (ZYRTEC) 10 MG tablet, Take 10 mg by mouth daily., Disp: , Rfl:  .  cyclobenzaprine (FLEXERIL) 5 MG tablet, Take 5 mg by mouth 3 (three) times daily as needed., Disp: , Rfl:  .  Multiple Vitamin (MULTI-VITAMIN) tablet, Take 1 tablet by mouth daily., Disp: , Rfl:   No Known Allergies Review of Systems Objective:  There were no vitals filed for this visit.  General: Well developed, nourished, in no acute distress, alert and oriented x3   Dermatological: Skin is warm, dry and supple bilateral. Nails x 10 are well maintained; remaining integument appears unremarkable at this time. There are no open sores, no preulcerative lesions, no rash or signs of infection present.  Reactive hyperkeratotic lesion PIPJ fifth digit bilateral however she also has darkening of the toes at the level of the PIPJ and DIPJ.  This is just hyperpigmentation associated with being of color and postinflammatory hyperpigmentation and mechanical irritation.  Assessment  Vascular: Dorsalis Pedis artery and Posterior Tibial artery pedal pulses are 2/4 bilateral with immedate capillary fill time. Pedal hair growth present.  No varicosities and no lower extremity edema present bilateral.   Neruologic: Grossly intact via light touch bilateral. Vibratory intact via tuning fork bilateral. Protective threshold with Semmes Wienstein monofilament intact to all pedal sites bilateral. Patellar and Achilles deep tendon reflexes 2+ bilateral. No Babinski or clonus noted bilateral.   Musculoskeletal: No gross boney pedal deformities bilateral. No pain, crepitus, or limitation noted with foot and ankle range of motion bilateral. Muscular strength 5/5 in all groups tested bilateral.  Flexible toes with the second toe being slightly longer than the first adductovarus rotated hammertoe deformities and mild Ortiz toe deformities.  She also has mild hallux interphalangeal bilateral noncomplicated. Gait: Unassisted, Nonantalgic.    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  Plan: Discussed surgical and nonsurgical options with her today we will follow-up with her as needed.     Cathy Ortiz T. [place], [place]

## 2019-10-01 ENCOUNTER — Ambulatory Visit
Admission: EM | Admit: 2019-10-01 | Discharge: 2019-10-01 | Disposition: A | Discharge status: Home / Self Care | Payer: Medicaid Other | Attending: Family Medicine | Admitting: Family Medicine

## 2019-10-01 ENCOUNTER — Other Ambulatory Visit: Payer: Self-pay

## 2019-10-01 DIAGNOSIS — S8391XA Sprain of unspecified site of right knee, initial encounter: Secondary | ICD-10-CM

## 2019-10-01 DIAGNOSIS — M25561 Pain in right knee: Secondary | ICD-10-CM | POA: Diagnosis not present

## 2019-10-01 MED ORDER — DEXAMETHASONE SODIUM PHOSPHATE 10 MG/ML IJ SOLN
10.0000 mg | Freq: Once | INTRAMUSCULAR | Status: AC
  Administered 2019-10-01: 10 mg via INTRAMUSCULAR

## 2019-10-01 NOTE — Discharge Instructions (Signed)
Take ibuprofen as needed.  Rest and elevate your leg.  Apply ice packs 2-3 times a day for up to 20 minutes each.  Wear the knee sleeve as needed for comfort.    You have received a steroid injection in the office today to help with inflammation  Follow up with your primary care provider or an orthopedist if you symptoms continue or worsen;  Or if you develop new symptoms, such as numbness, tingling, or weakness.

## 2019-10-01 NOTE — ED Provider Notes (Signed)
[facility]   [id number] 10/01/19 Arrival Time: 4259  DG:LOVFI PAIN  SUBJECTIVE: History from: patient. Cathy Ortiz is a 29 y.o. female complains of right knee pain that began yesterday.  Denies a precipitating event or specific injury. Describes the pain as intermittent and achy in character. Has not attempted OTC treatment. States that she has GI irritation with NSAIDS.  Symptoms are made worse with activity.  Denies similar symptoms in the past.  Denies fever, chills, erythema, ecchymosis, effusion, weakness, numbness and tingling, saddle paresthesias, loss of bowel or bladder function.      ROS: As per HPI.  All other pertinent ROS negative.     Past Medical History:  Diagnosis Date   BV (bacterial vaginosis)    Recurrent   History of chlamydia    Seasonal allergies    Past Surgical History:  Procedure Laterality Date   WISDOM TOOTH EXTRACTION  2016/2017   ALL FOUR   No Known Allergies No current facility-administered medications on file prior to encounter.   Current Outpatient Medications on File Prior to Encounter  Medication Sig Dispense Refill   cetirizine (ZYRTEC) 10 MG tablet Take 10 mg by mouth daily.     cyclobenzaprine (FLEXERIL) 5 MG tablet Take 5 mg by mouth 3 (three) times daily as needed.     Multiple Vitamin (MULTI-VITAMIN) tablet Take 1 tablet by mouth daily.     [DISCONTINUED] fluticasone (FLONASE) 50 MCG/ACT nasal spray Place 1 spray into the nose 2 (two) times daily.     [DISCONTINUED] levonorgestrel (MIRENA) 20 MCG/24HR IUD 1 each by Intrauterine route once.     Social History   Socioeconomic History   Marital status: Single    Spouse name: Not on file   Number of children: Not on file   Years of education: Not on file   Highest education level: Not on file  Occupational History   Not on file  Tobacco Use   Smoking status: Never Smoker   Smokeless tobacco: Never Used  Vaping Use   Vaping Use: Never used  Substance and  Sexual Activity   Alcohol use: Not Currently   Drug use: Never   Sexual activity: Not Currently    Birth control/protection: I.U.D.  Other Topics Concern   Not on file  Social History Narrative   Not on file   Social Determinants of Health   Financial Resource Strain:    Difficulty of Paying Living Expenses: Not on file  Food Insecurity:    Worried About Running Out of Food in the Last Year: Not on file   Ran Out of Food in the Last Year: Not on file  Transportation Needs:    Lack of Transportation (Medical): Not on file   Lack of Transportation (Non-Medical): Not on file  Physical Activity:    Days of Exercise per Week: Not on file   Minutes of Exercise per Session: Not on file  Stress:    Feeling of Stress : Not on file  Social Connections:    Frequency of Communication with Friends and Family: Not on file   Frequency of Social Gatherings with Friends and Family: Not on file   Attends Religious Services: Not on file   Active Member of Clubs or Organizations: Not on file   Attends Banker Meetings: Not on file   Marital Status: Not on file  Intimate Partner Violence:    Fear of Current or Ex-Partner: Not on file   Emotionally Abused: Not on file  Physically Abused: Not on file   Sexually Abused: Not on file   Family History  Problem Relation Age of Onset   Healthy Mother    Healthy Father    Cancer Maternal Grandmother 53       LUNG   Breast cancer Paternal Grandmother 52    OBJECTIVE:  Vitals:   10/01/19 0955  BP: 130/78  Pulse: 90  Resp: 17  Temp: 99.2 F (37.3 C)  TempSrc: Oral  SpO2: 97%    General appearance: ALERT; in no acute distress.  Head: NCAT Lungs: Normal respiratory effort CV: pulses 2+ bilaterally. Cap refill < 2 seconds Musculoskeletal:  Inspection: Skin warm, dry, clear and intact without obvious erythema, effusion, or ecchymosis.  Palpation: Nontender to palpation ROM: FROM active and  passive Stability: Anterior/ posterior drawer intact Skin: warm and dry Neurologic: Ambulates without difficulty; Sensation intact about the upper/ lower extremities Psychological: alert and cooperative; normal mood and affect  DIAGNOSTIC STUDIES:  No results found.   ASSESSMENT & PLAN:  1. Sprain of right knee, unspecified ligament, initial encounter   2. Acute pain of right knee       Meds ordered this encounter  Medications   dexamethasone (DECADRON) injection 10 mg    Decadron 10mg  IM in office today in place of NSAIDS Knee sleeve provided Continue conservative management of rest, ice, and gentle stretches  Follow up with PCP or ortho if symptoms persist Return or go to the ER if you have any new or worsening symptoms (fever, chills, chest pain, abdominal pain, changes in bowel or bladder habits, pain radiating into lower legs)   Reviewed expectations re: course of current medical issues. Questions answered. Outlined signs and symptoms indicating need for more acute intervention. Patient verbalized understanding. After Visit Summary given.       , NP 10/01/19 1046

## 2019-10-01 NOTE — ED Triage Notes (Signed)
Pt is here with right knee pain that started 2 days ago, pt has not taken any meds to relieve discomfort.

## 2019-10-16 ENCOUNTER — Other Ambulatory Visit: Payer: Self-pay

## 2019-10-16 ENCOUNTER — Ambulatory Visit
Admission: EM | Admit: 2019-10-16 | Discharge: 2019-10-16 | Disposition: A | Discharge status: Home / Self Care | Payer: Medicaid Other | Attending: Family Medicine | Admitting: Family Medicine

## 2019-10-16 DIAGNOSIS — N898 Other specified noninflammatory disorders of vagina: Secondary | ICD-10-CM

## 2019-10-16 MED ORDER — METRONIDAZOLE 500 MG PO TABS: 500.0000 mg | ORAL_TABLET | Freq: Two times a day (BID) | ORAL | 0 refills | Status: DC

## 2019-10-16 NOTE — ED Provider Notes (Signed)
[facility]   [id number] 10/16/19 Arrival Time: 1434   CC: VAGINAL DISCHARGE  SUBJECTIVE:  Cathy Ortiz is a 29 y.o. female who presents with complaints of abrupt vaginal discharge that began about 2 days ago. Reports recent sexual encounter. Reports hx recurrent BV. Has not attempted OTC treatment.  She reports similar symptoms in the past. She denies fever, chills, nausea, vomiting, abdominal or pelvic pain, urinary symptoms, vaginal itching, vaginal odor, vaginal bleeding, dyspareunia, vaginal rashes or lesions.   Patient's last menstrual period was 10/04/2019 (exact date). Current birth control method: Compliant with BC:  ROS: As per HPI.  All other pertinent ROS negative.     Past Medical History:  Diagnosis Date  . BV (bacterial vaginosis)    Recurrent  . History of chlamydia   . Seasonal allergies    Past Surgical History:  Procedure Laterality Date  . WISDOM TOOTH EXTRACTION  2016/2017   ALL FOUR   No Known Allergies No current facility-administered medications on file prior to encounter.   Current Outpatient Medications on File Prior to Encounter  Medication Sig Dispense Refill  . cetirizine (ZYRTEC) 10 MG tablet Take 10 mg by mouth daily.    . Multiple Vitamin (MULTI-VITAMIN) tablet Take 1 tablet by mouth daily.    . cyclobenzaprine (FLEXERIL) 5 MG tablet Take 5 mg by mouth 3 (three) times daily as needed.    . [DISCONTINUED] fluticasone (FLONASE) 50 MCG/ACT nasal spray Place 1 spray into the nose 2 (two) times daily.    . [DISCONTINUED] levonorgestrel (MIRENA) 20 MCG/24HR IUD 1 each by Intrauterine route once.      Social History   Socioeconomic History  . Marital status: Single    Spouse name: Not on file  . Number of children: Not on file  . Years of education: Not on file  . Highest education level: Not on file  Occupational History  . Not on file  Tobacco Use  . Smoking status: Never Smoker  . Smokeless tobacco: Never Used  Vaping Use  .  Vaping Use: Never used  Substance and Sexual Activity  . Alcohol use: Not Currently  . Drug use: Never  . Sexual activity: Not Currently    Birth control/protection: I.U.D.  Other Topics Concern  . Not on file  Social History Narrative  . Not on file   Social Determinants of Health   Financial Resource Strain:   . Difficulty of Paying Living Expenses: Not on file  Food Insecurity:   . Worried About Programme researcher, broadcasting/film/video in the Last Year: Not on file  . Ran Out of Food in the Last Year: Not on file  Transportation Needs:   . Lack of Transportation (Medical): Not on file  . Lack of Transportation (Non-Medical): Not on file  Physical Activity:   . Days of Exercise per Week: Not on file  . Minutes of Exercise per Session: Not on file  Stress:   . Feeling of Stress : Not on file  Social Connections:   . Frequency of Communication with Friends and Family: Not on file  . Frequency of Social Gatherings with Friends and Family: Not on file  . Attends Religious Services: Not on file  . Active Member of Clubs or Organizations: Not on file  . Attends Banker Meetings: Not on file  . Marital Status: Not on file  Intimate Partner Violence:   . Fear of Current or Ex-Partner: Not on file  . Emotionally Abused: Not on file  .  Physically Abused: Not on file  . Sexually Abused: Not on file   Family History  Problem Relation Age of Onset  . Healthy Mother   . Healthy Father   . Cancer Maternal Grandmother 37       LUNG  . Breast cancer Paternal Grandmother 5    OBJECTIVE:  Vitals:   10/16/19 1442  BP: 135/84  Pulse: 68  Resp: 14  Temp: 98.8 F (37.1 C)  SpO2: 99%     General appearance: Alert, NAD, appears stated age Head: NCAT Throat: lips, mucosa, and tongue normal; teeth and gums normal Lungs: CTA bilaterally without adventitious breath sounds Heart: regular rate and rhythm.  Radial pulses 2+ symmetrical bilaterally Back: no CVA tenderness Abdomen: soft,  non-tender; bowel sounds normal; no masses or organomegaly; no guarding or rebound tenderness GU: declines  Skin: warm and dry Psychological:  Alert and cooperative. Normal mood and affect.  LABS:  Results for orders placed or performed during the hospital encounter of 07/27/19  Urine culture   Specimen: Urine, Clean Catch  Result Value Ref Range   Specimen Description      URINE, CLEAN CATCH Performed at [facility], [address], [place], [place] [address]    Special Requests      Normal Performed at [facility], [address], [place], [place] [address]    Culture 20,000 COLONIES/mL ESCHERICHIA COLI (A)    Report Status 07/29/2019 FINAL    Organism ID, Bacteria ESCHERICHIA COLI (A)       Susceptibility   Escherichia coli - MIC*    AMPICILLIN <=2 SENSITIVE Sensitive     CEFAZOLIN <=4 SENSITIVE Sensitive     CEFTRIAXONE <=0.25 SENSITIVE Sensitive     CIPROFLOXACIN <=0.25 SENSITIVE Sensitive     GENTAMICIN <=1 SENSITIVE Sensitive     IMIPENEM <=0.25 SENSITIVE Sensitive     NITROFURANTOIN <=16 SENSITIVE Sensitive     TRIMETH/SULFA <=20 SENSITIVE Sensitive     AMPICILLIN/SULBACTAM <=2 SENSITIVE Sensitive     PIP/TAZO <=4 SENSITIVE Sensitive     * 20,000 COLONIES/mL ESCHERICHIA COLI  Urinalysis, Complete w Microscopic  Result Value Ref Range   Color, Urine YELLOW YELLOW   APPearance CLEAR CLEAR   Specific Gravity, Urine <1.005 (L) 1.005 - 1.030   pH 5.5 5.0 - 8.0   Glucose, UA NEGATIVE NEGATIVE mg/dL   Hgb urine dipstick SMALL (A) NEGATIVE   Bilirubin Urine NEGATIVE NEGATIVE   Ketones, ur NEGATIVE NEGATIVE mg/dL   Protein, ur NEGATIVE NEGATIVE mg/dL   Nitrite NEGATIVE NEGATIVE   Leukocytes,Ua SMALL (A) NEGATIVE   Squamous Epithelial / LPF 0-5 0 - 5   WBC, UA 11-20 0 - 5 WBC/hpf   RBC / HPF 0-5 0 - 5 RBC/hpf   Bacteria, UA FEW (A) NONE SEEN   WBC Clumps PRESENT     Labs Reviewed - No data to display  ASSESSMENT & PLAN:  1. Vaginal  discharge     Meds ordered this encounter  Medications  . metroNIDAZOLE (FLAGYL) 500 MG tablet    Sig: Take 1 tablet (500 mg total) by mouth 2 (two) times daily.    Dispense:  14 tablet    Refill:  0    Order Specific Question:   Supervising Provider    Answer:   Merrilee Jansky [id number]   Prescribed metronidazole 500 mg twice daily for 7 days (do not take while consuming alcohol and/or if breastfeeding) Take medications as prescribed and to completion  May try boric acid vaginal suppositories as well Follow up with PCP or [facility] if symptoms persists Return here or go to ER if you have any new or worsening symptoms fever, chills, nausea, vomiting, abdominal or pelvic pain, painful intercourse, vaginal discharge, vaginal bleeding, persistent symptoms despite treatment Reviewed expectations re: course of current medical issues. Questions answered. Outlined signs and symptoms indicating need for more acute intervention. Patient verbalized understanding. After Visit Summary given.       Moshe Cipro, NP 10/16/19 1502

## 2019-10-16 NOTE — Discharge Instructions (Addendum)
Prescribed metronidazole 500mg  twice a day for 7 days  May also use Boric acid vaginal suppositories daily x 30 days, then 2-3 times per week to maintain pH  Follow up as needed

## 2019-10-16 NOTE — ED Triage Notes (Signed)
Patient c/o vaginal discharge. Hx recurrent BV.

## 2019-10-29 ENCOUNTER — Telehealth: Payer: Self-pay

## 2019-10-29 NOTE — Telephone Encounter (Signed)
Pt calling; has NOB sched for the 22nd; yesterday she had some cramping and brown spotting.  [phone]  Adv this is normal; to watch for bright red bleeding like a period; if that happens she needs to be seen.  Pt states cramps are like period cramps.  Adv cramping is normal throughout the pregnancy as long as it doesn't double her over or stop her in her tracks.  Pt reassured.

## 2019-11-14 ENCOUNTER — Ambulatory Visit (INDEPENDENT_AMBULATORY_CARE_PROVIDER_SITE_OTHER): Payer: Medicaid Other | Admitting: Obstetrics & Gynecology

## 2019-11-14 ENCOUNTER — Other Ambulatory Visit (HOSPITAL_COMMUNITY)
Admission: RE | Admit: 2019-11-14 | Discharge: 2019-11-14 | Disposition: A | Discharge status: Home / Self Care | Payer: Medicaid Other | Source: Ambulatory Visit | Attending: Obstetrics & Gynecology | Admitting: Obstetrics & Gynecology

## 2019-11-14 ENCOUNTER — Encounter: Payer: Self-pay | Admitting: Obstetrics & Gynecology

## 2019-11-14 ENCOUNTER — Other Ambulatory Visit: Payer: Self-pay

## 2019-11-14 VITALS — BP 120/80 | Wt 136.0 lb

## 2019-11-14 DIAGNOSIS — Z3A01 Less than 8 weeks gestation of pregnancy: Secondary | ICD-10-CM | POA: Diagnosis not present

## 2019-11-14 DIAGNOSIS — Z3201 Encounter for pregnancy test, result positive: Secondary | ICD-10-CM

## 2019-11-14 DIAGNOSIS — Z3491 Encounter for supervision of normal pregnancy, unspecified, first trimester: Secondary | ICD-10-CM

## 2019-11-14 DIAGNOSIS — Z369 Encounter for antenatal screening, unspecified: Secondary | ICD-10-CM | POA: Diagnosis present

## 2019-11-14 DIAGNOSIS — Z3490 Encounter for supervision of normal pregnancy, unspecified, unspecified trimester: Secondary | ICD-10-CM | POA: Insufficient documentation

## 2019-11-14 LAB — POCT URINALYSIS DIPSTICK OB
Glucose, UA: NEGATIVE
POC,PROTEIN,UA: NEGATIVE

## 2019-11-14 LAB — POCT URINE PREGNANCY: Preg Test, Ur: POSITIVE — AB

## 2019-11-14 NOTE — Progress Notes (Signed)
11/14/2019   Chief Complaint: Missed period  Transfer of Care Patient: no  History of Present Illness: Ms. Cathy Ortiz is a 29 y.o. [id number] [redacted]w[redacted]d based on Patient's last menstrual period was 10/04/2019 (exact date). with an Estimated Date of Delivery: 07/10/20, with the above CC.   Her periods were: regular periods every 28 days She was using no method when she conceived.  She has Positive signs or symptoms of nausea/vomiting of pregnancy. She has Negative signs or symptoms of miscarriage or preterm labor She identifies Negative Zika risk factors for her and her partner On any different medications around the time she conceived/early pregnancy: no History of varicella: Yes   ROS: A 12-point review of systems was performed and negative, except as stated in the above HPI.  OBGYN History: As per HPI. OB History  Gravida Para Term Preterm AB Living  3 2 2  0 0 2  SAB TAB Ectopic Multiple Live Births  0 0 0 0 2    # Outcome Date GA Lbr Len/2nd Weight Sex Delivery Anes PTL Lv  3 Current           2 Term 02/08/16 [redacted]w[redacted]d  6 lb 15 oz (3.147 kg) M Vag-Spont   LIV  1 Term 06/13/14 [redacted]w[redacted]d  5 lb 15 oz (2.693 kg) F Vag-Spont   LIV     Complications: Preeclampsia    Any issues with any prior pregnancies: no. Gest HTN w G1. Any prior children are healthy, doing well, without any problems or issues: yes History of pap smears: Yes. Last pap smear 2015. Abnormal: no  History of STIs: No   Past Medical History: Past Medical History:  Diagnosis Date   BV (bacterial vaginosis)    Recurrent   History of chlamydia    Seasonal allergies     Past Surgical History: Past Surgical History:  Procedure Laterality Date   WISDOM TOOTH EXTRACTION  2016/2017   ALL FOUR    Family History:  Family History  Problem Relation Age of Onset   Healthy Mother    Healthy Father    Cancer Maternal Grandmother 89       LUNG   Breast cancer Paternal Grandmother 40   She denies any female cancers,  bleeding or blood clotting disorders.  She denies any history of mental retardation, birth defects or genetic disorders in her or the FOB's history  Social History:  Social History   Socioeconomic History   Marital status: Single    Spouse name: Not on file   Number of children: Not on file   Years of education: Not on file   Highest education level: Not on file  Occupational History   Not on file  Tobacco Use   Smoking status: Never Smoker   Smokeless tobacco: Never Used  Vaping Use   Vaping Use: Never used  Substance and Sexual Activity   Alcohol use: Not Currently   Drug use: Never   Sexual activity: Not Currently    Birth control/protection: I.U.D.  Other Topics Concern   Not on file  Social History Narrative   Not on file   Social Determinants of Health   Financial Resource Strain:    Difficulty of Paying Living Expenses: Not on file  Food Insecurity:    Worried About Running Out of Food in the Last Year: Not on file   Ran Out of Food in the Last Year: Not on file  Transportation Needs:    Lack of Transportation (Medical): Not on file  Lack of Transportation (Non-Medical): Not on file  Physical Activity:    Days of Exercise per Week: Not on file   Minutes of Exercise per Session: Not on file  Stress:    Feeling of Stress : Not on file  Social Connections:    Frequency of Communication with Friends and Family: Not on file   Frequency of Social Gatherings with Friends and Family: Not on file   Attends Religious Services: Not on file   Active Member of Clubs or Organizations: Not on file   Attends Banker Meetings: Not on file   Marital Status: Not on file  Intimate Partner Violence:    Fear of Current or Ex-Partner: Not on file   Emotionally Abused: Not on file   Physically Abused: Not on file   Sexually Abused: Not on file   Any pets in the household: no  Allergy: No Known Allergies  Current Outpatient  Medications:  Current Outpatient Medications:    cholecalciferol (VITAMIN D3) 25 MCG (1000 UNIT) tablet, Take 1,000 Units by mouth daily., Disp: , Rfl:    Multiple Vitamin (MULTI-VITAMIN) tablet, Take 1 tablet by mouth daily., Disp: , Rfl:    cetirizine (ZYRTEC) 10 MG tablet, Take 10 mg by mouth daily. (Patient not taking: Reported on 11/14/2019), Disp: , Rfl:    cyclobenzaprine (FLEXERIL) 5 MG tablet, Take 5 mg by mouth 3 (three) times daily as needed. (Patient not taking: Reported on 11/14/2019), Disp: , Rfl:    metroNIDAZOLE (FLAGYL) 500 MG tablet, Take 1 tablet (500 mg total) by mouth 2 (two) times daily. (Patient not taking: Reported on 11/14/2019), Disp: 14 tablet, Rfl: 0   Physical Exam:   BP 120/80    Wt 136 lb (61.7 kg)    LMP 10/04/2019 (Exact Date)    BMI 25.70 kg/m  Body mass index is 25.7 kg/m. Constitutional: Well nourished, well developed female in no acute distress.  Neck:  Supple, normal appearance, and no thyromegaly  Cardiovascular: S1, S2 normal, no murmur, rub or gallop, regular rate and rhythm Respiratory:  Clear to auscultation bilateral. Normal respiratory effort Abdomen: positive bowel sounds and no masses, hernias; diffusely non tender to palpation, non distended Breasts: breasts appear normal, no suspicious masses, no skin or nipple changes or axillary nodes. Neuro/Psych:  Normal mood and affect.  Skin:  Warm and dry.  Lymphatic:  No inguinal lymphadenopathy.   Pelvic exam: is not limited by body habitus EGBUS: within normal limits, Vagina: within normal limits and with no blood in the vault, Cervix: normal appearing cervix without discharge or lesions, closed/long/high, Uterus:  enlarged: 6 weeks, retroverted, and Adnexa:  normal adnexa  Assessment: Ms. Cathy Ortiz is a 29 y.o. [id number] [redacted]w[redacted]d based on Patient's last menstrual period was 10/04/2019 (exact date). with an Estimated Date of Delivery: 07/10/20,  for prenatal care.  Plan:  1) Avoid alcoholic  beverages. 2) Patient encouraged not to smoke.  3) Discontinue the use of all non-medicinal drugs and chemicals.  4) Take prenatal vitamins daily.  5) Seatbelt use advised 6) Nutrition, food safety (fish, cheese advisories, and high nitrite foods) and exercise discussed. 7) Hospital and practice style delivering at [facility] discussed  8) Patient is asked about travel to areas at risk for the Zika virus, and counseled to avoid travel and exposure to mosquitoes or sexual partners who may have themselves been exposed to the virus. Testing is discussed, and will be ordered as appropriate.  9) Childbirth classes at [facility] advised 10) Genetic Screening,  such as with 1st Trimester Screening, cell free fetal DNA, AFP testing, and Ultrasound, as well as with amniocentesis and CVS as appropriate, is discussed with patient. She plans to consider genetic testing this pregnancy. 11) [place] soon  Problem list reviewed and updated.  Annamarie Major, MD, [facility], [facility] 11/14/2019  10:50 AM

## 2019-11-14 NOTE — Patient Instructions (Signed)
Due Date 07/10/2020 Ultrasound to confirm soon  First Trimester of Pregnancy The first trimester of pregnancy is from week 1 until the end of week 13 (months 1 through 3). A week after a sperm fertilizes an egg, the egg will implant on the wall of the uterus. This embryo will begin to develop into a baby. Genes from you and your partner will form the baby. The female genes will determine whether the baby will be a boy or a girl. At 6-8 weeks, the eyes and face will be formed, and the heartbeat can be seen on ultrasound. At the end of 12 weeks, all the baby's organs will be formed. Now that you are pregnant, you will want to do everything you can to have a healthy baby. Two of the most important things are to get good prenatal care and to follow your health care provider's instructions. Prenatal care is all the medical care you receive before the baby's birth. This care will help prevent, find, and treat any problems during the pregnancy and childbirth. Body changes during your first trimester Your body goes through many changes during pregnancy. The changes vary from woman to woman.  You may gain or lose a couple of pounds at first.  You may feel sick to your stomach (nauseous) and you may throw up (vomit). If the vomiting is uncontrollable, call your health care provider.  You may tire easily.  You may develop headaches that can be relieved by medicines. All medicines should be approved by your health care provider.  You may urinate more often. Painful urination may mean you have a bladder infection.  You may develop heartburn as a result of your pregnancy.  You may develop constipation because certain hormones are causing the muscles that push stool through your intestines to slow down.  You may develop hemorrhoids or swollen veins (varicose veins).  Your breasts may begin to grow larger and become tender. Your nipples may stick out more, and the tissue that surrounds them (areola) may become  darker.  Your gums may bleed and may be sensitive to brushing and flossing.  Dark spots or blotches (chloasma, mask of pregnancy) may develop on your face. This will likely fade after the baby is born.  Your menstrual periods will stop.  You may have a loss of appetite.  You may develop cravings for certain kinds of food.  You may have changes in your emotions from day to day, such as being excited to be pregnant or being concerned that something may go wrong with the pregnancy and baby.  You may have more vivid and strange dreams.  You may have changes in your hair. These can include thickening of your hair, rapid growth, and changes in texture. Some women also have hair loss during or after pregnancy, or hair that feels dry or thin. Your hair will most likely return to normal after your baby is born. What to expect at prenatal visits During a routine prenatal visit:  You will be weighed to make sure you and the baby are growing normally.  Your blood pressure will be taken.  Your abdomen will be measured to track your baby's growth.  The fetal heartbeat will be listened to between weeks 10 and 14 of your pregnancy.  Test results from any previous visits will be discussed. Your health care provider may ask you:  How you are feeling.  If you are feeling the baby move.  If you have had any abnormal symptoms, such  as leaking fluid, bleeding, severe headaches, or abdominal cramping.  If you are using any tobacco products, including cigarettes, chewing tobacco, and electronic cigarettes.  If you have any questions. Other tests that may be performed during your first trimester include:  Blood tests to find your blood type and to check for the presence of any previous infections. The tests will also be used to check for low iron levels (anemia) and protein on red blood cells (Rh antibodies). Depending on your risk factors, or if you previously had diabetes during pregnancy, you may  have tests to check for high blood sugar that affects pregnant women (gestational diabetes).  Urine tests to check for infections, diabetes, or protein in the urine.  An ultrasound to confirm the proper growth and development of the baby.  Fetal screens for spinal cord problems (spina bifida) and Down syndrome.  HIV (human immunodeficiency virus) testing. Routine prenatal testing includes screening for HIV, unless you choose not to have this test.  You may need other tests to make sure you and the baby are doing well. Follow these instructions at home: Medicines  Follow your health care provider's instructions regarding medicine use. Specific medicines may be either safe or unsafe to take during pregnancy.  Take a prenatal vitamin that contains at least 600 micrograms (mcg) of folic acid.  If you develop constipation, try taking a stool softener if your health care provider approves. Eating and drinking   Eat a balanced diet that includes fresh fruits and vegetables, whole grains, good sources of protein such as meat, eggs, or tofu, and low-fat dairy. Your health care provider will help you determine the amount of weight gain that is right for you.  Avoid raw meat and uncooked cheese. These carry germs that can cause birth defects in the baby.  Eating four or five small meals rather than three large meals a day may help relieve nausea and vomiting. If you start to feel nauseous, eating a few soda crackers can be helpful. Drinking liquids between meals, instead of during meals, also seems to help ease nausea and vomiting.  Limit foods that are high in fat and processed sugars, such as fried and sweet foods.  To prevent constipation: ? Eat foods that are high in fiber, such as fresh fruits and vegetables, whole grains, and beans. ? Drink enough fluid to keep your urine clear or pale yellow. Activity  Exercise only as directed by your health care provider. Most women can continue  their usual exercise routine during pregnancy. Try to exercise for 30 minutes at least 5 days a week. Exercising will help you: ? Control your weight. ? Stay in shape. ? Be prepared for labor and delivery.  Experiencing pain or cramping in the lower abdomen or lower back is a good sign that you should stop exercising. Check with your health care provider before continuing with normal exercises.  Try to avoid standing for long periods of time. Move your legs often if you must stand in one place for a long time.  Avoid heavy lifting.  Wear low-heeled shoes and practice good posture.  You may continue to have sex unless your health care provider tells you not to. Relieving pain and discomfort  Wear a good support bra to relieve breast tenderness.  Take warm sitz baths to soothe any pain or discomfort caused by hemorrhoids. Use hemorrhoid cream if your health care provider approves.  Rest with your legs elevated if you have leg cramps or low back  pain.  If you develop varicose veins in your legs, wear support hose. Elevate your feet for 15 minutes, 3-4 times a day. Limit salt in your diet. Prenatal care  Schedule your prenatal visits by the twelfth week of pregnancy. They are usually scheduled monthly at first, then more often in the last 2 months before delivery.  Write down your questions. Take them to your prenatal visits.  Keep all your prenatal visits as told by your health care provider. This is important. Safety  Wear your seat belt at all times when driving.  Make a list of emergency phone numbers, including numbers for family, friends, the hospital, and police and fire departments. General instructions  Ask your health care provider for a referral to a local prenatal education class. Begin classes no later than the beginning of month 6 of your pregnancy.  Ask for help if you have counseling or nutritional needs during pregnancy. Your health care provider can offer advice  or refer you to specialists for help with various needs.  Do not use hot tubs, steam rooms, or saunas.  Do not douche or use tampons or scented sanitary pads.  Do not cross your legs for long periods of time.  Avoid cat litter boxes and soil used by cats. These carry germs that can cause birth defects in the baby and possibly loss of the fetus by miscarriage or stillbirth.  Avoid all smoking, herbs, alcohol, and medicines not prescribed by your health care provider. Chemicals in these products affect the formation and growth of the baby.  Do not use any products that contain nicotine or tobacco, such as cigarettes and e-cigarettes. If you need help quitting, ask your health care provider. You may receive counseling support and other resources to help you quit.  Schedule a dentist appointment. At home, brush your teeth with a soft toothbrush and be gentle when you floss. Contact a health care provider if:  You have dizziness.  You have mild pelvic cramps, pelvic pressure, or nagging pain in the abdominal area.  You have persistent nausea, vomiting, or diarrhea.  You have a bad smelling vaginal discharge.  You have pain when you urinate.  You notice increased swelling in your face, hands, legs, or ankles.  You are exposed to fifth disease or chickenpox.  You are exposed to [place] measles (rubella) and have never had it. Get help right away if:  You have a fever.  You are leaking fluid from your vagina.  You have spotting or bleeding from your vagina.  You have severe abdominal cramping or pain.  You have rapid weight gain or loss.  You vomit blood or material that looks like coffee grounds.  You develop a severe headache.  You have shortness of breath.  You have any kind of trauma, such as from a fall or a car accident. Summary  The first trimester of pregnancy is from week 1 until the end of week 13 (months 1 through 3).  Your body goes through many changes  during pregnancy. The changes vary from woman to woman.  You will have routine prenatal visits. During those visits, your health care provider will examine you, discuss any test results you may have, and talk with you about how you are feeling. This information is not intended to replace advice given to you by your health care provider. Make sure you discuss any questions you have with your health care provider. Document Revised: 12/22/2016 Document Reviewed: 12/22/2015 Elsevier Patient Education  [phone]  [employer].

## 2019-11-17 LAB — URINE CULTURE

## 2019-11-18 LAB — CYTOLOGY - PAP
Adequacy: ABSENT
Comment: NEGATIVE
Comment: NEGATIVE
Comment: NORMAL
Diagnosis: NEGATIVE
High risk HPV: POSITIVE — AB
Neisseria Gonorrhea: NEGATIVE
Trichomonas: NEGATIVE

## 2019-11-20 ENCOUNTER — Other Ambulatory Visit: Payer: Self-pay

## 2019-11-20 ENCOUNTER — Encounter: Payer: Self-pay | Admitting: Advanced Practice Midwife

## 2019-11-20 ENCOUNTER — Other Ambulatory Visit: Payer: Self-pay | Admitting: Obstetrics & Gynecology

## 2019-11-20 ENCOUNTER — Ambulatory Visit (INDEPENDENT_AMBULATORY_CARE_PROVIDER_SITE_OTHER): Payer: Medicaid Other | Admitting: Advanced Practice Midwife

## 2019-11-20 ENCOUNTER — Ambulatory Visit (INDEPENDENT_AMBULATORY_CARE_PROVIDER_SITE_OTHER): Payer: Medicaid Other

## 2019-11-20 VITALS — BP 109/66 | Wt 139.0 lb

## 2019-11-20 DIAGNOSIS — Z369 Encounter for antenatal screening, unspecified: Secondary | ICD-10-CM | POA: Diagnosis not present

## 2019-11-20 DIAGNOSIS — N926 Irregular menstruation, unspecified: Secondary | ICD-10-CM

## 2019-11-20 DIAGNOSIS — Z3481 Encounter for supervision of other normal pregnancy, first trimester: Secondary | ICD-10-CM

## 2019-11-20 NOTE — Progress Notes (Signed)
  Routine Prenatal Care Visit  Subjective  Cathy Ortiz is a 29 y.o. G3P2002 at [redacted]w[redacted]d being seen today for ongoing prenatal care.  She is currently monitored for the following issues for this low-risk pregnancy and has Breast nodule; Depressive disorder; Heart murmur on physical examination; Herpes simplex; Left shoulder pain; Migraines; PMDD (premenstrual dysphoric disorder); Prediabetes; and Supervision of low-risk pregnancy, first trimester on their problem list.  ----------------------------------------------------------------------------------- Patient reports being hungry all the time, and having breast tenderness.  We reviewed results of ultrasound that was done this morning.  . Vag. Bleeding: None.   . Leaking Fluid denies.  ----------------------------------------------------------------------------------- The following portions of the patient's history were reviewed and updated as appropriate: allergies, current medications, past family history, past medical history, past social history, past surgical history and problem list. Problem list updated.  Objective  Blood pressure 109/66, weight 139 lb (63 kg), last menstrual period 10/04/2019. Pregravid weight 128 lb (58.1 kg) Total Weight Gain 11 lb (4.99 kg) Urinalysis: Urine Protein    Urine Glucose    Fetal Status: Fetal Heart Rate (bpm): absent          Patient Name: Cathy Ortiz DOB: [birth year] MRN: [id number]   ULTRASOUND REPORT  Location: [facility] Date of Service: 11/20/2019   Indications:dating   Findings:  There is slight chance that an irregularly shaped gestational  sac is forming in the uterus measuring 5.5 mm.  No yolk sac, fetal pole or heartbeat is seen today.   Right Ovary is normal in appearance. Left Ovary is normal appearance. Corpus luteal cyst:  Right ovary Survey of the adnexa demonstrates no adnexal masses. There is no free peritoneal fluid in the cul de sac.  Impression: 1. There is  questionably an irregularly shaped, small gestational sac within the uterus.  2. No fetal pole, heartbeat or yolk sac seen.   Recommendations: 1.Clinical correlation with the patient's History and Physical Exam  Deanna Artis, RT  General:  Alert, oriented and cooperative. Patient is in no acute distress.  Skin: Skin is warm and dry. No rash noted.   Cardiovascular: Normal heart rate noted  Respiratory: Normal respiratory effort, no problems with respiration noted  Abdomen: Soft, gravid, appropriate for gestational age.       Pelvic:  Cervical exam deferred        Extremities: Normal range of motion.     Mental Status: Normal mood and affect. Normal behavior. Normal judgment and thought content.   Assessment   29 y.o. Cathy Ortiz at [redacted]w[redacted]d by  07/10/2020, by Last Menstrual Period presenting for routine prenatal visit  Plan   Beta Hcg today Beta Hcg in 2 days Viability scan in 10-14 days  Preterm labor symptoms and general obstetric precautions including but not limited to vaginal bleeding, contractions, leaking of fluid and fetal movement were reviewed in detail with the patient.    Return in about 2 weeks (around 12/04/2019) for viability scan and rob in 10-14 days.    Tresea Mall, CNM 11/20/2019 11:52 AM

## 2019-11-20 NOTE — Progress Notes (Signed)
No vb, no lof, was told in U/S that sac was not visible un sure if it was to early or not viable.

## 2019-11-20 NOTE — Addendum Note (Signed)
Addended by: Clement Husbands A on: 11/20/2019 12:13 PM   Modules accepted: Orders

## 2019-11-21 LAB — BETA HCG QUANT (REF LAB): hCG Quant: 46233 m[IU]/mL

## 2019-11-23 LAB — BETA HCG QUANT (REF LAB): hCG Quant: 52364 m[IU]/mL

## 2019-11-25 ENCOUNTER — Other Ambulatory Visit: Payer: Self-pay | Admitting: Obstetrics and Gynecology

## 2019-11-25 ENCOUNTER — Ambulatory Visit (INDEPENDENT_AMBULATORY_CARE_PROVIDER_SITE_OTHER): Payer: Medicaid Other

## 2019-11-25 ENCOUNTER — Other Ambulatory Visit: Payer: Self-pay

## 2019-11-25 ENCOUNTER — Encounter: Payer: Self-pay | Admitting: Obstetrics and Gynecology

## 2019-11-25 ENCOUNTER — Other Ambulatory Visit: Payer: Self-pay | Admitting: Advanced Practice Midwife

## 2019-11-25 ENCOUNTER — Ambulatory Visit (INDEPENDENT_AMBULATORY_CARE_PROVIDER_SITE_OTHER): Payer: Medicaid Other | Admitting: Obstetrics and Gynecology

## 2019-11-25 VITALS — BP 114/74 | Wt 139.0 lb

## 2019-11-25 DIAGNOSIS — O209 Hemorrhage in early pregnancy, unspecified: Secondary | ICD-10-CM

## 2019-11-25 DIAGNOSIS — Z3A01 Less than 8 weeks gestation of pregnancy: Secondary | ICD-10-CM

## 2019-11-25 DIAGNOSIS — Z3491 Encounter for supervision of normal pregnancy, unspecified, first trimester: Secondary | ICD-10-CM

## 2019-11-25 DIAGNOSIS — Z3481 Encounter for supervision of other normal pregnancy, first trimester: Secondary | ICD-10-CM | POA: Diagnosis not present

## 2019-11-25 NOTE — Progress Notes (Signed)
Routine Prenatal Care Visit  Subjective  Cathy Ortiz is a 29 y.o. G3P2002 at [redacted]w[redacted]d being seen today for ongoing prenatal care.  She is currently monitored for the following issues for this low-risk pregnancy and has Breast nodule; Depressive disorder; Heart murmur on physical examination; Herpes simplex; Left shoulder pain; Migraines; PMDD (premenstrual dysphoric disorder); Prediabetes; and Supervision of low-risk pregnancy, first trimester on their problem list.  ----------------------------------------------------------------------------------- Patient reports no complaints.  Denies vaginal bleeding and abdominal pain  . Vag. Bleeding: None.   . Leaking Fluid denies.  U/S shows interval growth of gestational sac. No definitive yolk sac orfetal pole noted on exam. Small-moderate anechoic free fluid in the cul-de-sac.   10/28: hCG 89,381 10/30: hCG 01,751 ----------------------------------------------------------------------------------- The following portions of the patient's history were reviewed and updated as appropriate: allergies, current medications, past family history, past medical history, past social history, past surgical history and problem list. Problem list updated.  Objective  Blood pressure 114/74, weight 139 lb (63 kg), last menstrual period 10/04/2019. Pregravid weight 128 lb (58.1 kg) Total Weight Gain 11 lb (4.99 kg) Urinalysis: Urine Protein    Urine Glucose    Fetal Status:           General:  Alert, oriented and cooperative. Patient is in no acute distress.  Skin: Skin is warm and dry. No rash noted.   Cardiovascular: Normal heart rate noted  Respiratory: Normal respiratory effort, no problems with respiration noted  Abdomen: Soft, gravid, appropriate for gestational age. Pain/Pressure: Absent     Pelvic:  Cervical exam deferred        Extremities: Normal range of motion.     Mental Status: Normal mood and affect. Normal behavior. Normal judgment and thought  content.   Imaging Results US OB Transvaginal  Result Date: 11/25/2019 Patient Name: Cathy Ortiz DOB: [birth year] MRN: [id number] ULTRASOUND REPORT Location: [facility] Date of Service: 11/25/2019 Indications:Viability Findings: There is a single, irregularly shaped gesational sac in the uterus measuring 9.7 mm. No definite yolk sac, fetal pole or heartbeat is seen. Right Ovary is normal in appearance. Left Ovary is normal appearance. Corpus luteal cyst:  Right ovary Survey of the adnexa demonstrates a simple cyst adjacent to the right ovary measuring 11.4 x 15.8 x 7.3 mm There is some anechoic free fluid in the pelvis measuring 21.8 x 13.9 x 49.7 mm Impression: 1.  There appears to be a gestational sac seen within the uterus. No yolk sac,fetal pole or heartbeat is seen at this time. 2. Corpus luteum in the right ovary. 3. There is a simple cyst adjacent to the right ovary. 4. There is some anechoic fluid in the pelvis and around the right ovary. Recommendations: 1.Clinical correlation with the patient's History and Physical Exam. 2. Follow up ultrasound 2 weeks from 11/20/19 ultrasound, or as clinically indicated. Deanna Artis, RT There is a viable singleton gestation.  Detailed evaluation of the fetal anatomy is precluded by early gestational age.  It must be noted that a normal ultrasound particular at this early gestational age is unable to rule out fetal aneuploidy, risk of first trimester miscarriage, or anatomic birth defects. Thomasene Mohair, MD, [facility], [facility] 11/25/2019 3:32 PM      Assessment   29 y.o. [id number] at [redacted]w[redacted]d by  07/10/2020, by Last Menstrual Period presenting for work-in prenatal visit  Plan   pregnancy3  Problems (from 10/04/19 to present)    Problem Noted Resolved   Supervision of low-risk  pregnancy, first trimester 11/14/2019 by Nadara Mustard, MD No       Preterm labor symptoms and general obstetric precautions including but not  limited to vaginal bleeding, contractions, leaking of fluid and fetal movement were reviewed in detail with the patient. Please refer to After Visit Summary for other counseling recommendations.   There is not enough information to definitively diagnose a normal pregnancy, an abnormal pregnancy, an ectopic pregnancy, or molar pregnancy. All are in the differential at this point. Precautions given. Will have her keep her next ultrasound appt to assess viability.    Return for previously scheduled appts.   Thomasene Mohair, MD, [facility], [facility] 11/25/2019 3:33 PM

## 2019-12-03 ENCOUNTER — Ambulatory Visit: Payer: Medicaid Other

## 2019-12-03 ENCOUNTER — Encounter: Payer: Medicaid Other | Admitting: Advanced Practice Midwife

## 2019-12-03 DIAGNOSIS — Z3A01 Less than 8 weeks gestation of pregnancy: Secondary | ICD-10-CM

## 2019-12-03 DIAGNOSIS — O209 Hemorrhage in early pregnancy, unspecified: Secondary | ICD-10-CM

## 2019-12-03 DIAGNOSIS — Z3481 Encounter for supervision of other normal pregnancy, first trimester: Secondary | ICD-10-CM

## 2019-12-05 ENCOUNTER — Other Ambulatory Visit: Payer: Medicaid Other

## 2019-12-05 ENCOUNTER — Other Ambulatory Visit: Payer: Self-pay

## 2019-12-05 ENCOUNTER — Ambulatory Visit: Payer: Medicaid Other

## 2019-12-05 ENCOUNTER — Encounter: Payer: Medicaid Other | Admitting: Advanced Practice Midwife

## 2019-12-05 ENCOUNTER — Encounter: Payer: Medicaid Other | Admitting: Obstetrics and Gynecology

## 2019-12-05 ENCOUNTER — Encounter: Payer: Self-pay | Admitting: Obstetrics & Gynecology

## 2019-12-05 ENCOUNTER — Ambulatory Visit (INDEPENDENT_AMBULATORY_CARE_PROVIDER_SITE_OTHER): Payer: Medicaid Other | Admitting: Obstetrics & Gynecology

## 2019-12-05 VITALS — BP 120/80 | Ht 61.0 in | Wt 141.0 lb

## 2019-12-05 DIAGNOSIS — O283 Abnormal ultrasonic finding on antenatal screening of mother: Secondary | ICD-10-CM

## 2019-12-05 DIAGNOSIS — Z3A08 8 weeks gestation of pregnancy: Secondary | ICD-10-CM | POA: Diagnosis not present

## 2019-12-05 DIAGNOSIS — O368310 Maternal care for abnormalities of the fetal heart rate or rhythm, first trimester, not applicable or unspecified: Secondary | ICD-10-CM | POA: Diagnosis not present

## 2019-12-05 DIAGNOSIS — O020 Blighted ovum and nonhydatidiform mole: Secondary | ICD-10-CM | POA: Diagnosis not present

## 2019-12-05 DIAGNOSIS — O2691 Pregnancy related conditions, unspecified, first trimester: Secondary | ICD-10-CM | POA: Diagnosis not present

## 2019-12-05 NOTE — Progress Notes (Signed)
°  HPI: Pt presents in early pregnancy w abn rising beta hCG levels and [place] f/u.  Denies pain, bleeding, and has mild nausea and breast T.  PMHx: She  has a past medical history of BV (bacterial vaginosis), History of chlamydia, and Seasonal allergies. Also,  has a past surgical history that includes Wisdom tooth extraction (2016/2017)., family history includes Breast cancer (age of onset: 81) in her paternal grandmother; Cancer (age of onset: 24) in her maternal grandmother; Healthy in her father and mother.,  reports that she has never smoked. She has never used smokeless tobacco. She reports previous alcohol use. She reports that she does not use drugs.  She has a current medication list which includes the following prescription(s): cetirizine, cholecalciferol, multi-vitamin, [DISCONTINUED] fluticasone, and [DISCONTINUED] levonorgestrel. Also, has No Known Allergies.  Review of Systems  All other systems reviewed and are negative.   Objective: BP 120/80    Ht 5\' 1"  (1.549 m)    Wt 141 lb (64 kg)    LMP 10/04/2019 (Exact Date)    BMI 26.64 kg/m   Physical examination Constitutional NAD, Conversant  Skin No rashes, lesions or ulceration.   Extremities: Moves all appropriately.  Normal ROM for age. No lymphadenopathy.  Neuro: Grossly intact  Psych: Oriented to PPT.  Normal mood. Normal affect.   Assessment:  Abnormal pregnancy in first trimester  ULTRASOUND REPORT  Location: [facility] Date of Service: 12/05/2019   Indications:Assess for abnormal pregnancy Findings:  There is a single, irregularly shaped gesational sac in the uterus measuring 10.7 mm. No definite yolk sac, fetal pole or heartbeat is seen.   Right Ovary is normal in appearance. Left Ovary is normal appearance. Corpus luteal cyst:  is not visualized Survey of the adnexa demonstrates no adnexal masses. There is no free peritoneal fluid in the cul de sac.  Impression: 1. Difficult to ascertain pregnancy  location, w small gest sac similar size to last 13/12/2019 11/25/19  Recommendations: 1.Clinical correlation with the patient's History and Physical Exam. 2. Cont to monitor for sx's of miscarriage or ectopic 3.  13/2/21 Monday in follow up  A total of 20 minutes were spent face-to-face with the patient as well as preparation, review, communication, and documentation during this encounter.   Monday, MD, [facility], [facility] 12/05/2019  4:47 PM

## 2019-12-08 ENCOUNTER — Ambulatory Visit
Admission: RE | Admit: 2019-12-08 | Discharge: 2019-12-08 | Disposition: A | Discharge status: Home / Self Care | Payer: Medicaid Other | Source: Ambulatory Visit | Attending: Obstetrics & Gynecology | Admitting: Obstetrics & Gynecology

## 2019-12-08 ENCOUNTER — Telehealth: Payer: Self-pay | Admitting: Obstetrics & Gynecology

## 2019-12-08 ENCOUNTER — Other Ambulatory Visit: Payer: Self-pay

## 2019-12-08 ENCOUNTER — Other Ambulatory Visit: Payer: Self-pay | Admitting: Obstetrics & Gynecology

## 2019-12-08 DIAGNOSIS — O2691 Pregnancy related conditions, unspecified, first trimester: Secondary | ICD-10-CM

## 2019-12-08 NOTE — Telephone Encounter (Signed)
Did you call pt?

## 2019-12-08 NOTE — Telephone Encounter (Signed)
Patient returning call about results.   Cb# [phone]

## 2019-12-09 ENCOUNTER — Other Ambulatory Visit: Payer: Medicaid Other

## 2019-12-09 DIAGNOSIS — O2691 Pregnancy related conditions, unspecified, first trimester: Secondary | ICD-10-CM

## 2019-12-10 ENCOUNTER — Other Ambulatory Visit: Payer: Self-pay | Admitting: Obstetrics & Gynecology

## 2019-12-10 DIAGNOSIS — O2691 Pregnancy related conditions, unspecified, first trimester: Secondary | ICD-10-CM

## 2019-12-10 LAB — BETA HCG QUANT (REF LAB): hCG Quant: 45327 m[IU]/mL

## 2019-12-10 NOTE — Progress Notes (Signed)
Pt aware.

## 2019-12-10 NOTE — Progress Notes (Signed)
Let her know pregnancy hormone levels coming down slightly.  Need to continue to check on this w repeat testing again Monday.  Monitor for symptoms of pain or bleeding.  I am off today (which is why you are calling) but if she starts having symptoms then she would need to be seen.

## 2019-12-12 ENCOUNTER — Encounter: Payer: Self-pay | Admitting: Obstetrics & Gynecology

## 2019-12-12 ENCOUNTER — Ambulatory Visit (INDEPENDENT_AMBULATORY_CARE_PROVIDER_SITE_OTHER): Payer: Medicaid Other | Admitting: Obstetrics & Gynecology

## 2019-12-12 ENCOUNTER — Other Ambulatory Visit: Payer: Self-pay

## 2019-12-12 VITALS — BP 120/70 | Ht 61.0 in | Wt 142.0 lb

## 2019-12-12 DIAGNOSIS — O209 Hemorrhage in early pregnancy, unspecified: Secondary | ICD-10-CM

## 2019-12-12 DIAGNOSIS — O2691 Pregnancy related conditions, unspecified, first trimester: Secondary | ICD-10-CM | POA: Diagnosis not present

## 2019-12-12 NOTE — Progress Notes (Signed)
  History of Present Illness:  Cathy Ortiz is a 29 y.o. who recently underwent labwork for abnormal pregnancy, have been following her beta hCG levels as well as [place].  She presents for discussion of results and plan of management. She reports episode of bleeding last night, now just brown discharge.  No pain.  PMHx: She  has a past medical history of BV (bacterial vaginosis), History of chlamydia, and Seasonal allergies. Also,  has a past surgical history that includes Wisdom tooth extraction (2016/2017)., family history includes Breast cancer (age of onset: 32) in her paternal grandmother; Cancer (age of onset: 78) in her maternal grandmother; Healthy in her father and mother.,  reports that she has never smoked. She has never used smokeless tobacco. She reports previous alcohol use. She reports that she does not use drugs. Current Meds  Medication Sig  . cetirizine (ZYRTEC) 10 MG tablet Take 10 mg by mouth daily.   . cholecalciferol (VITAMIN D3) 25 MCG (1000 UNIT) tablet Take 1,000 Units by mouth daily.   . Multiple Vitamin (MULTI-VITAMIN) tablet Take 1 tablet by mouth daily.   . Also, has No Known Allergies..  Review of Systems  All other systems reviewed and are negative.  Physical Exam:  BP 120/70   Ht 5\' 1"  (1.549 m)   Wt 142 lb (64.4 kg)   LMP 10/04/2019 (Exact Date)   BMI 26.83 kg/m  Body mass index is 26.83 kg/m. Constitutional: Well nourished, well developed female in no acute distress.  Abdomen: diffusely non tender to palpation, non distended, and no masses, hernias Neuro: Grossly intact Psych:  Normal mood and affect.    Assessment:  Problem List Items Addressed This Visit   Visit Diagnoses    Vaginal bleeding in pregnancy, first trimester    -  Primary   Abnormal pregnancy in first trimester          Plan: Detailed discussion of results today.  Lab- Beta hCG. Options for management discussed. Info provided. At this time, we will plan for monitoring over weekend If  this Beta hCG level is not coming down well, then consideration for MTX or D&C is discussed and will be planned (discussion on Monday). She was amenable to this plan and we will see her back PRN.  A total of 20 minutes were spent face-to-face with the patient as well as preparation, review, communication, and documentation during this encounter.    Saturday, MD, [facility], [facility] 12/12/2019  2:26 PM

## 2019-12-13 LAB — BETA HCG QUANT (REF LAB): hCG Quant: 35760 m[IU]/mL

## 2019-12-15 ENCOUNTER — Other Ambulatory Visit: Payer: Self-pay | Admitting: Obstetrics & Gynecology

## 2019-12-15 DIAGNOSIS — O2691 Pregnancy related conditions, unspecified, first trimester: Secondary | ICD-10-CM

## 2019-12-15 NOTE — Progress Notes (Signed)
Discussed lab results w pt She reports min sx's of bleeding, and no pain Discussed MTX, D&C as options if beta hCG levels do not return to zero Repeat level tomorrow and weekly  Annamarie Major, MD, [facility], [facility] 12/15/2019  8:02 AM

## 2019-12-16 ENCOUNTER — Other Ambulatory Visit: Payer: Medicaid Other

## 2019-12-16 ENCOUNTER — Other Ambulatory Visit: Payer: Self-pay

## 2019-12-16 DIAGNOSIS — O2691 Pregnancy related conditions, unspecified, first trimester: Secondary | ICD-10-CM

## 2019-12-17 ENCOUNTER — Other Ambulatory Visit: Payer: Self-pay | Admitting: Obstetrics & Gynecology

## 2019-12-17 DIAGNOSIS — O2691 Pregnancy related conditions, unspecified, first trimester: Secondary | ICD-10-CM

## 2019-12-17 LAB — BETA HCG QUANT (REF LAB): hCG Quant: 28911 m[IU]/mL

## 2019-12-22 ENCOUNTER — Other Ambulatory Visit: Payer: Medicaid Other

## 2019-12-22 ENCOUNTER — Other Ambulatory Visit: Payer: Self-pay | Admitting: Obstetrics & Gynecology

## 2019-12-22 ENCOUNTER — Telehealth: Payer: Self-pay | Admitting: Obstetrics & Gynecology

## 2019-12-22 ENCOUNTER — Ambulatory Visit
Admission: EM | Admit: 2019-12-22 | Discharge: 2019-12-22 | Disposition: A | Discharge status: Home / Self Care | Payer: Medicaid Other

## 2019-12-22 ENCOUNTER — Other Ambulatory Visit: Payer: Self-pay

## 2019-12-22 DIAGNOSIS — N912 Amenorrhea, unspecified: Secondary | ICD-10-CM

## 2019-12-22 DIAGNOSIS — O2691 Pregnancy related conditions, unspecified, first trimester: Secondary | ICD-10-CM

## 2019-12-22 NOTE — Telephone Encounter (Signed)
Pt aware.

## 2019-12-22 NOTE — Telephone Encounter (Signed)
Patient came in for labs today. Patient has some questions she would like to speak with [facility]PH. Please advise

## 2019-12-22 NOTE — Telephone Encounter (Signed)
Started Bleeding on the 28th last night passed a lot clots, has a fishy odor wanting to know if this is a infection. Should she come in and be seen?

## 2019-12-22 NOTE — Telephone Encounter (Signed)
Bleeding expected and hopefully is finishing this process for her.  Odor may be related to this bleeding and pregnancy.  Lets give it a couple days and also see what labs show today.

## 2019-12-23 ENCOUNTER — Other Ambulatory Visit: Payer: Self-pay | Admitting: Obstetrics & Gynecology

## 2019-12-23 ENCOUNTER — Telehealth: Payer: Self-pay | Admitting: Obstetrics & Gynecology

## 2019-12-23 DIAGNOSIS — O2691 Pregnancy related conditions, unspecified, first trimester: Secondary | ICD-10-CM

## 2019-12-23 DIAGNOSIS — N912 Amenorrhea, unspecified: Secondary | ICD-10-CM

## 2019-12-23 LAB — BETA HCG QUANT (REF LAB): hCG Quant: 6238 m[IU]/mL

## 2019-12-23 NOTE — Progress Notes (Signed)
Lab appt Mon plz.  Dr Jerene Pitch, do you mind letting patient know results next week (as I am out of town)?  We have been trending her beta's down from abnormal pregnancy, since they were so high for a time.  We avoided MTX, and last check finally came down significantly from 28,000 to 6,200.  She has passed clots and 'tissue' last weekend.  It should be close to normal by Dec 6.  Thank you so much.

## 2019-12-23 NOTE — Telephone Encounter (Signed)
Called and left generic message for patient to call back to confirm scheduled labsd appointment on 12/29/19 at 9:20

## 2019-12-23 NOTE — Telephone Encounter (Signed)
-----   Message from Nadara Mustard, MD sent at 12/23/2019  8:01 AM EST ----- Lab appt Mon plz.  Dr Jerene Pitch, do you mind letting patient know results next week (as I am out of town)?  We have been trending her beta's down from abnormal pregnancy, since they were so high for a time.  We avoided MTX, and last check finally came down significantly  from 28,000 to 6,200.  She has passed clots and 'tissue' last weekend.  It should be close to normal by Dec 6.  Thank you so much.

## 2019-12-29 ENCOUNTER — Other Ambulatory Visit: Payer: Medicaid Other

## 2019-12-29 ENCOUNTER — Other Ambulatory Visit: Payer: Self-pay

## 2019-12-29 ENCOUNTER — Other Ambulatory Visit: Payer: Self-pay | Admitting: Obstetrics & Gynecology

## 2019-12-29 DIAGNOSIS — N912 Amenorrhea, unspecified: Secondary | ICD-10-CM

## 2019-12-29 DIAGNOSIS — O2691 Pregnancy related conditions, unspecified, first trimester: Secondary | ICD-10-CM

## 2019-12-31 ENCOUNTER — Telehealth: Payer: Self-pay | Admitting: Obstetrics & Gynecology

## 2019-12-31 LAB — BETA HCG QUANT (REF LAB): hCG Quant: 458 m[IU]/mL

## 2019-12-31 NOTE — Telephone Encounter (Signed)
Results not back yet pt aware

## 2019-12-31 NOTE — Telephone Encounter (Signed)
Pt is calling requesting lab results from Monday 12/31/19.

## 2020-01-17 ENCOUNTER — Other Ambulatory Visit: Payer: Self-pay | Admitting: Obstetrics & Gynecology

## 2020-01-17 DIAGNOSIS — N912 Amenorrhea, unspecified: Secondary | ICD-10-CM

## 2020-02-06 ENCOUNTER — Other Ambulatory Visit: Payer: Self-pay | Admitting: Obstetrics & Gynecology

## 2020-02-16 ENCOUNTER — Other Ambulatory Visit: Payer: Self-pay | Admitting: Obstetrics & Gynecology

## 2020-02-16 DIAGNOSIS — O209 Hemorrhage in early pregnancy, unspecified: Secondary | ICD-10-CM

## 2020-03-04 ENCOUNTER — Other Ambulatory Visit: Payer: Self-pay

## 2020-03-04 ENCOUNTER — Ambulatory Visit (INDEPENDENT_AMBULATORY_CARE_PROVIDER_SITE_OTHER): Payer: Medicaid Other | Admitting: Obstetrics & Gynecology

## 2020-03-04 ENCOUNTER — Encounter: Payer: Self-pay | Admitting: Obstetrics & Gynecology

## 2020-03-04 VITALS — BP 100/70 | Ht 61.0 in | Wt 141.0 lb

## 2020-03-04 DIAGNOSIS — N912 Amenorrhea, unspecified: Secondary | ICD-10-CM

## 2020-03-04 NOTE — Progress Notes (Signed)
  Amenorrhea Patient presents for evaluation of missed period this month, and this comes after a prolonged time w miscarriage in Nov 2021.  She had slowly regressing beta hCG levels during that time.  She does feel she had a period 01/27/20, although it was different in length (8 days) and intensity, associated w spotting afterwards and crampy abdominal pains.  Usually has 26 day cycle length.  PMHx: She  has a past medical history of BV (bacterial vaginosis), History of chlamydia, and Seasonal allergies. Also,  has a past surgical history that includes Wisdom tooth extraction (2016/2017)., family history includes Breast cancer (age of onset: 81) in her paternal grandmother; Cancer (age of onset: 64) in her maternal grandmother; Healthy in her father and mother.,  reports that she has never smoked. She has never used smokeless tobacco. She reports previous alcohol use. She reports that she does not use drugs.  She has a current medication list which includes the following prescription(s): cetirizine, cholecalciferol, multi-vitamin, sertraline, valacyclovir, [DISCONTINUED] fluticasone, and [DISCONTINUED] levonorgestrel. Also, has No Known Allergies.  Review of Systems  All other systems reviewed and are negative.   Objective: BP 100/70   Ht 5\' 1"  (1.549 m)   Wt 141 lb (64 kg)   LMP 01/27/2020   Breastfeeding Unknown   BMI 26.64 kg/m  Physical Exam Constitutional:      General: She is not in acute distress.    Appearance: She is well-developed and well-nourished.  Musculoskeletal:        General: Normal range of motion.  Neurological:     Mental Status: She is alert and oriented to person, place, and time.  Skin:    General: Skin is warm and dry.  Psychiatric:        Mood and Affect: Mood and affect normal.  Vitals reviewed.     ASSESSMENT/PLAN:    Problem List Items Addressed This Visit     Amenorrhea    -  Primary   Relevant Orders   Beta hCG quant (ref lab)   03/26/2020 PELVIC  COMPLETE WITH TRANSVAGINAL    May be still off cycle s/p miscarriage Will assess for anatomic concerns or abn pregnancy  A total of 20 minutes were spent face-to-face with the patient as well as preparation, review, communication, and documentation during this encounter.    [place], MD, [facility], [facility] 03/04/2020  11:02 AM

## 2020-03-05 LAB — BETA HCG QUANT (REF LAB): hCG Quant: 1 m[IU]/mL

## 2020-03-08 ENCOUNTER — Ambulatory Visit: Payer: Medicaid Other | Admitting: Obstetrics & Gynecology

## 2020-03-08 ENCOUNTER — Ambulatory Visit: Payer: Medicaid Other

## 2020-03-08 DIAGNOSIS — N912 Amenorrhea, unspecified: Secondary | ICD-10-CM

## 2020-04-05 ENCOUNTER — Encounter: Payer: Self-pay | Admitting: Family Medicine

## 2020-04-05 ENCOUNTER — Other Ambulatory Visit: Payer: Self-pay

## 2020-04-05 ENCOUNTER — Ambulatory Visit
Admission: EM | Admit: 2020-04-05 | Discharge: 2020-04-05 | Disposition: A | Discharge status: Home / Self Care | Payer: Medicaid Other | Attending: Family Medicine | Admitting: Family Medicine

## 2020-04-05 DIAGNOSIS — N898 Other specified noninflammatory disorders of vagina: Secondary | ICD-10-CM | POA: Diagnosis present

## 2020-04-05 LAB — POCT URINE PREGNANCY: Preg Test, Ur: NEGATIVE

## 2020-04-05 MED ORDER — METRONIDAZOLE 0.75 % VA GEL: 1.0000 | Freq: Every day | VAGINAL | 0 refills | Status: AC

## 2020-04-05 NOTE — ED Provider Notes (Signed)
[name]    CSN: [id number] Arrival date & time: 04/05/20  3785      History   Chief Complaint Chief Complaint  Patient presents with  . Vaginal Discharge    HPI Cathy Ortiz is a 30 y.o. female.    Vaginal Discharge Quality:  Malodorous and yellow Severity:  Moderate Timing:  Constant Progression:  Unchanged Context: spontaneously   Context: not after intercourse, not after urination, not at rest, not during bowel movement and not during intercourse   Relieved by:  Nothing Worsened by:  Nothing Ineffective treatments:  None tried Associated symptoms: no abdominal pain, no dyspareunia, no dysuria, no fever, no genital lesions, no nausea, no rash, no urinary frequency, no urinary hesitancy, no urinary incontinence, no vaginal itching and no vomiting   Risk factors: prior miscarriage and unprotected sex   Risk factors: no new sexual partner, no PID, no STI, no STI exposure and no terminated pregnancy     Past Medical History:  Diagnosis Date  . BV (bacterial vaginosis)    Recurrent  . History of chlamydia   . Seasonal allergies     Patient Active Problem List   Diagnosis Date Noted  . Supervision of low-risk pregnancy, first trimester 11/14/2019  . PMDD (premenstrual dysphoric disorder) 10/24/2018  . Left shoulder pain 03/06/2018  . Depressive disorder 08/08/2017  . Prediabetes 08/08/2017  . Herpes simplex 05/28/2017  . Breast nodule 01/20/2014  . Heart murmur on physical examination 01/20/2014  . Migraines 01/20/2014    Past Surgical History:  Procedure Laterality Date  . WISDOM TOOTH EXTRACTION  2016/2017   ALL FOUR    OB History    Gravida  3   Para  2   Term  2   Preterm  0   AB  0   Living  2     SAB  0   IAB  0   Ectopic  0   Multiple  0   Live Births  2            Home Medications    Prior to Admission medications   Medication Sig Start Date End Date Taking? Authorizing Provider  cholecalciferol (VITAMIN  D3) 25 MCG (1000 UNIT) tablet Take 1,000 Units by mouth daily.   Yes [provider]  fluticasone (FLONASE) 50 MCG/ACT nasal spray Place into both nostrils daily.   Yes [provider]  metroNIDAZOLE (METROGEL) 0.75 % vaginal gel Place 1 Applicatorful vaginally at bedtime for 5 days. 04/05/20 04/10/20 Yes Andras Grunewald A, NP  sertraline (ZOLOFT) 100 MG tablet Take by mouth. 01/29/20 01/28/21 Yes [provider]  cetirizine (ZYRTEC) 10 MG tablet Take 10 mg by mouth daily.    [provider]  Multiple Vitamin (MULTI-VITAMIN) tablet Take 1 tablet by mouth daily.    [provider]  valACYclovir (VALTREX) 500 MG tablet Take 500 mg by mouth daily. 01/23/20   [provider]  levonorgestrel (MIRENA) 20 MCG/24HR IUD 1 each by Intrauterine route once.  03/09/19  [provider]    Family History Family History  Problem Relation Age of Onset  . Healthy Mother   . Healthy Father   . Cancer Maternal Grandmother 52       LUNG  . Breast cancer Paternal Grandmother 20    Social History Social History   Tobacco Use  . Smoking status: Never Smoker  . Smokeless tobacco: Never Used  Vaping Use  . Vaping Use: Never used  Substance  Use Topics  . Alcohol use: Not Currently  . Drug use: Never     Allergies   Patient has no known allergies.   Review of Systems Review of Systems  Constitutional: Negative for fever.  Gastrointestinal: Negative for abdominal pain, nausea and vomiting.  Genitourinary: Positive for vaginal discharge. Negative for bladder incontinence, dyspareunia, dysuria and hesitancy.     Physical Exam Triage Vital Signs ED Triage Vitals  Enc Vitals Group     BP 04/05/20 0831 110/77     Pulse Rate 04/05/20 0831 83     Resp 04/05/20 0831 18     Temp 04/05/20 0831 98.6 F (37 C)     Temp Source 04/05/20 0831 Oral     SpO2 04/05/20 0831 98 %     Weight --      Height --      Head Circumference --      Peak Flow --       Pain Score 04/05/20 0832 0     Pain Loc --      Pain Edu? --      Excl. in GC? --    No data found.  Updated Vital Signs BP 110/77 (BP Location: Left Arm)   Pulse 83   Temp 98.6 F (37 C) (Oral)   Resp 18   LMP 03/16/2020   SpO2 98%   Visual Acuity Right Eye Distance:   Left Eye Distance:   Bilateral Distance:    Right Eye Near:   Left Eye Near:    Bilateral Near:     Physical Exam Vitals and nursing note reviewed.  Constitutional:      General: She is not in acute distress.    Appearance: Normal appearance. She is not ill-appearing, toxic-appearing or diaphoretic.  HENT:     Head: Normocephalic.  Eyes:     Conjunctiva/sclera: Conjunctivae normal.  Pulmonary:     Effort: Pulmonary effort is normal.  Musculoskeletal:        General: Normal range of motion.     Cervical back: Normal range of motion.  Skin:    General: Skin is warm and dry.     Findings: No rash.  Neurological:     Mental Status: She is alert.  Psychiatric:        Mood and Affect: Mood normal.      UC Treatments / Results  Labs (all labs ordered are listed, but only abnormal results are displayed) Labs Reviewed  POCT URINE PREGNANCY  CERVICOVAGINAL ANCILLARY ONLY    EKG   Radiology No results found.  Procedures Procedures (including critical care time)  Medications Ordered in UC Medications - No data to display  Initial Impression / Assessment and Plan / UC Course  I have reviewed the triage vital signs and the nursing notes.  Pertinent labs & imaging results that were available during my care of the patient were reviewed by me and considered in my medical decision making (see chart for details).     Vaginal discharge Swab sent for testing.  Treating for BV based on hx and symptoms.  Swab pending.  Final Clinical Impressions(s) / UC Diagnoses   Final diagnoses:  Vaginal discharge     Discharge Instructions     Treating you for BV I have sent your swab for  testing.  Follow up as needed for continued or worsening symptoms     ED Prescriptions    Medication Sig Dispense Auth. Provider   metroNIDAZOLE (METROGEL) 0.75 %  vaginal gel Place 1 Applicatorful vaginally at bedtime for 5 days. 50 g Dahlia Byes A, NP     PDMP not reviewed this encounter.   Janace Aris, NP 04/05/20 [phone]

## 2020-04-05 NOTE — ED Triage Notes (Signed)
Pt c/o yellow vaginal discharge with "fishy odor" for past two day.   Denies dysuria symptoms, vaginal bleeding, fever, n/v/d.

## 2020-04-05 NOTE — Discharge Instructions (Addendum)
Treating you for BV I have sent your swab for testing.  Follow up as needed for continued or worsening symptoms

## 2020-04-06 LAB — CERVICOVAGINAL ANCILLARY ONLY
Bacterial Vaginitis (gardnerella): POSITIVE — AB
Candida Glabrata: NEGATIVE
Candida Vaginitis: NEGATIVE
Chlamydia: NEGATIVE
Comment: NEGATIVE
Comment: NEGATIVE
Comment: NEGATIVE
Comment: NEGATIVE
Comment: NEGATIVE
Comment: NORMAL
Neisseria Gonorrhea: NEGATIVE
Trichomonas: NEGATIVE

## 2020-06-04 ENCOUNTER — Encounter: Payer: Self-pay | Admitting: Emergency Medicine

## 2020-06-04 ENCOUNTER — Ambulatory Visit
Admission: EM | Admit: 2020-06-04 | Discharge: 2020-06-04 | Disposition: A | Discharge status: Home / Self Care | Payer: Medicaid Other | Attending: Physician Assistant | Admitting: Physician Assistant

## 2020-06-04 ENCOUNTER — Other Ambulatory Visit: Payer: Self-pay

## 2020-06-04 DIAGNOSIS — Z28310 Unvaccinated for covid-19: Secondary | ICD-10-CM | POA: Insufficient documentation

## 2020-06-04 DIAGNOSIS — R0981 Nasal congestion: Secondary | ICD-10-CM | POA: Diagnosis present

## 2020-06-04 DIAGNOSIS — R059 Cough, unspecified: Secondary | ICD-10-CM | POA: Diagnosis not present

## 2020-06-04 DIAGNOSIS — Z20822 Contact with and (suspected) exposure to covid-19: Secondary | ICD-10-CM | POA: Diagnosis not present

## 2020-06-04 DIAGNOSIS — Z79899 Other long term (current) drug therapy: Secondary | ICD-10-CM | POA: Insufficient documentation

## 2020-06-04 DIAGNOSIS — J069 Acute upper respiratory infection, unspecified: Secondary | ICD-10-CM | POA: Diagnosis not present

## 2020-06-04 DIAGNOSIS — Z793 Long term (current) use of hormonal contraceptives: Secondary | ICD-10-CM | POA: Diagnosis not present

## 2020-06-04 NOTE — ED Triage Notes (Signed)
Patient c/o sinus congestion, nasal congestion, and chest congestion that started yesterday.  Patient denies fevers.

## 2020-06-04 NOTE — ED Provider Notes (Signed)
[facility]    CSN: [id number] Arrival date & time: 06/04/20  1920      History   Chief Complaint Chief Complaint  Patient presents with  . Cough  . Nasal Congestion    HPI Cathy Ortiz is a 30 y.o. female presenting for onset of nasal congestion/runny nose and cough yesterday.  Also admits to some sinus pressure and headaches.  Denies any fatigue, body aches, fevers, ear pain, chest pain or shortness of breath.  No abdominal pain, nausea/vomiting or diarrhea.  Patient has been around her mother who has been sick with a cold.  She says she had negative COVID test.  Patient has not been tested for COVID.  Patient has not received any COVID vaccines.  She has not taken any OTC meds for her symptoms.  No other concerns.  HPI  Past Medical History:  Diagnosis Date  . BV (bacterial vaginosis)    Recurrent  . History of chlamydia   . Seasonal allergies     Patient Active Problem List   Diagnosis Date Noted  . Supervision of low-risk pregnancy, first trimester 11/14/2019  . PMDD (premenstrual dysphoric disorder) 10/24/2018  . Left shoulder pain 03/06/2018  . Depressive disorder 08/08/2017  . Prediabetes 08/08/2017  . Herpes simplex 05/28/2017  . Breast nodule 01/20/2014  . Heart murmur on physical examination 01/20/2014  . Migraines 01/20/2014    Past Surgical History:  Procedure Laterality Date  . WISDOM TOOTH EXTRACTION  2016/2017   ALL FOUR    OB History    Gravida  3   Para  2   Term  2   Preterm  0   AB  0   Living  2     SAB  0   IAB  0   Ectopic  0   Multiple  0   Live Births  2            Home Medications    Prior to Admission medications   Medication Sig Start Date End Date Taking? Authorizing Provider  cetirizine (ZYRTEC) 10 MG tablet Take 10 mg by mouth daily.   Yes [provider]  cholecalciferol (VITAMIN D3) 25 MCG (1000 UNIT) tablet Take 1,000 Units by mouth daily.   Yes [provider]   fluticasone (FLONASE) 50 MCG/ACT nasal spray Place into both nostrils daily.   Yes [provider]  Multiple Vitamin (MULTI-VITAMIN) tablet Take 1 tablet by mouth daily.   Yes [provider]  sertraline (ZOLOFT) 100 MG tablet Take by mouth. 01/29/20 01/28/21 Yes [provider]  valACYclovir (VALTREX) 500 MG tablet Take 500 mg by mouth daily. 01/23/20   [provider]  levonorgestrel (MIRENA) 20 MCG/24HR IUD 1 each by Intrauterine route once.  03/09/19  [provider]    Family History Family History  Problem Relation Age of Onset  . Healthy Mother   . Healthy Father   . Cancer Maternal Grandmother 19       LUNG  . Breast cancer Paternal Grandmother 21    Social History Social History   Tobacco Use  . Smoking status: Never Smoker  . Smokeless tobacco: Never Used  Vaping Use  . Vaping Use: Never used  Substance Use Topics  . Alcohol use: Not Currently  . Drug use: Never     Allergies   Patient has no known allergies.   Review of Systems Review of Systems  Constitutional: Negative for chills, diaphoresis, fatigue and fever.  HENT:  Positive for congestion, rhinorrhea and sinus pressure. Negative for ear pain, sinus pain and sore throat.   Respiratory: Positive for cough. Negative for shortness of breath.   Gastrointestinal: Negative for abdominal pain, nausea and vomiting.  Musculoskeletal: Negative for arthralgias and myalgias.  Skin: Negative for rash.  Neurological: Positive for headaches. Negative for weakness.  Hematological: Negative for adenopathy.     Physical Exam Triage Vital Signs ED Triage Vitals  Enc Vitals Group     BP 06/04/20 1931 125/78     Pulse Rate 06/04/20 1931 72     Resp 06/04/20 1931 14     Temp 06/04/20 1931 98.9 F (37.2 C)     Temp Source 06/04/20 1931 Oral     SpO2 06/04/20 1931 97 %     Weight 06/04/20 1928 143 lb (64.9 kg)     Height 06/04/20 1928 5\' 1"  (1.549 m)     Head Circumference  --      Peak Flow --      Pain Score 06/04/20 1928 0     Pain Loc --      Pain Edu? --      Excl. in GC? --    No data found.  Updated Vital Signs BP 125/78 (BP Location: Left Arm)   Pulse 72   Temp 98.9 F (37.2 C) (Oral)   Resp 14   Ht 5\' 1"  (1.549 m)   Wt 143 lb (64.9 kg)   LMP 05/14/2020 (Approximate)   SpO2 97%   Breastfeeding No   BMI 27.02 kg/m    Physical Exam Vitals and nursing note reviewed.  Constitutional:      General: She is not in acute distress.    Appearance: Normal appearance. She is not ill-appearing or toxic-appearing.  HENT:     Head: Normocephalic and atraumatic.     Right Ear: Tympanic membrane, ear canal and external ear normal.     Left Ear: Tympanic membrane, ear canal and external ear normal.     Nose: Congestion and rhinorrhea present.     Mouth/Throat:     Mouth: Mucous membranes are moist.     Pharynx: Oropharynx is clear.  Eyes:     General: No scleral icterus.       Right eye: No discharge.        Left eye: No discharge.     Conjunctiva/sclera: Conjunctivae normal.  Cardiovascular:     Rate and Rhythm: Normal rate and regular rhythm.     Heart sounds: Normal heart sounds.  Pulmonary:     Effort: Pulmonary effort is normal. No respiratory distress.     Breath sounds: Normal breath sounds.  Musculoskeletal:     Cervical back: Neck supple.  Skin:    General: Skin is dry.  Neurological:     General: No focal deficit present.     Mental Status: She is alert. Mental status is at baseline.     Motor: No weakness.     Gait: Gait normal.  Psychiatric:        Mood and Affect: Mood normal.        Behavior: Behavior normal.        Thought Content: Thought content normal.      UC Treatments / Results  Labs (all labs ordered are listed, but only abnormal results are displayed) Labs Reviewed  SARS CORONAVIRUS 2 (TAT 6-24 HRS)    EKG   Radiology No results found.  Procedures Procedures (including critical care  time)  Medications Ordered in UC Medications - No data to display  Initial Impression / Assessment and Plan / UC Course  I have reviewed the triage vital signs and the nursing notes.  Pertinent labs & imaging results that were available during my care of the patient were reviewed by me and considered in my medical decision making (see chart for details).   30 year old female presenting for cough and congestion starting yesterday.  Vital signs are all normal and stable.  She is overall well-appearing.  Exam significant for minor nasal congestion and slightly yellow rhinorrhea.  Her chest is clear to auscultation and heart regular rate and rhythm.  COVID test obtained.  Current CDC guidelines, isolation protocol and ED precautions reviewed patient.  Advised her she likely has a viral URI.  Supportive care encouraged with increasing rest and fluids.  I did offer a cough medication but she declined.  Advised to take OTC Mucinex D and use Flonase and nasal saline.  Ibuprofen and Tylenol as needed for aches.  Advised to follow-up with [place] as needed for any worsening symptoms or she is not feeling better in 7 to 10 days.   Final Clinical Impressions(s) / UC Diagnoses   Final diagnoses:  Viral URI with cough  Nasal congestion     Discharge Instructions     URI/COLD SYMPTOMS: Your exam today is consistent with a viral illness. Antibiotics are not indicated at this time. Use medications as directed, including cough syrup, nasal saline, and decongestants. Your symptoms should improve over the next few days and resolve within 7-10 days. Increase rest and fluids. F/u if symptoms worsen or predominate such as sore throat, ear pain, productive cough, shortness of breath, or if you develop high fevers or worsening fatigue over the next several days.    You have received COVID testing today either for positive exposure, concerning symptoms that could be related to COVID infection, screening purposes, or  re-testing after confirmed positive.  Your test obtained today checks for active viral infection in the last 1-2 weeks. If your test is negative now, you can still test positive later. So, if you do develop symptoms you should either get re-tested and/or isolate x 5 days and then strict mask use x 5 days (unvaccinated) or mask use x 10 days (vaccinated). Please follow CDC guidelines.  While Rapid antigen tests come back in 15-20 minutes, send out PCR/molecular test results typically come back within 1-3 days. In the mean time, if you are symptomatic, assume this could be a positive test and treat/monitor yourself as if you do have COVID.   We will call with test results if positive. Please download the MyChart app and set up a profile to access test results.   If symptomatic, go home and rest. Push fluids. Take Tylenol as needed for discomfort. Gargle warm salt water. Throat lozenges. Take Mucinex DM or Robitussin for cough. Humidifier in bedroom to ease coughing. Warm showers. Also review the COVID handout for more information.  COVID-19 INFECTION: The incubation period of COVID-19 is approximately 14 days after exposure, with most symptoms developing in roughly 4-5 days. Symptoms may range in severity from mild to critically severe. Roughly 80% of those infected will have mild symptoms. People of any age may become infected with COVID-19 and have the ability to transmit the virus. The most common symptoms include: fever, fatigue, cough, body aches, headaches, sore throat, nasal congestion, shortness of breath, nausea, vomiting, diarrhea, changes in smell and/or taste.    COURSE  OF ILLNESS Some patients may begin with mild disease which can progress quickly into critical symptoms. If your symptoms are worsening please call ahead to the Emergency Department and proceed there for further treatment. Recovery time appears to be roughly 1-2 weeks for mild symptoms and 3-6 weeks for severe disease.   GO  IMMEDIATELY TO ER FOR FEVER YOU ARE UNABLE TO GET DOWN WITH TYLENOL, BREATHING PROBLEMS, CHEST PAIN, FATIGUE, LETHARGY, INABILITY TO EAT OR DRINK, ETC  QUARANTINE AND ISOLATION: To help decrease the spread of COVID-19 please remain isolated if you have COVID infection or are highly suspected to have COVID infection. This means -stay home and isolate to one room in the home if you live with others. Do not share a bed or bathroom with others while ill, sanitize and wipe down all countertops and keep common areas clean and disinfected. Stay home for 5 days. If you have no symptoms or your symptoms are resolving after 5 days, you can leave your house. Continue to wear a mask around others for 5 additional days. If you have been in close contact (within 6 feet) of someone diagnosed with COVID 19, you are advised to quarantine in your home for 14 days as symptoms can develop anywhere from 2-14 days after exposure to the virus. If you develop symptoms, you  must isolate.  Most current guidelines for COVID after exposure -unvaccinated: isolate 5 days and strict mask use x 5 days. Test on day 5 is possible -vaccinated: wear mask x 10 days if symptoms do not develop -You do not necessarily need to be tested for COVID if you have + exposure and  develop symptoms. Just isolate at home x10 days from symptom onset During this global pandemic, CDC advises to practice social distancing, try to stay at least 85ft away from others at all times. Wear a face covering. Wash and sanitize your hands regularly and avoid going anywhere that is not necessary.  KEEP IN MIND THAT THE COVID TEST IS NOT 100% ACCURATE AND YOU SHOULD STILL DO EVERYTHING TO PREVENT POTENTIAL SPREAD OF VIRUS TO OTHERS (WEAR MASK, WEAR GLOVES, WASH HANDS AND SANITIZE REGULARLY). IF INITIAL TEST IS NEGATIVE, THIS MAY NOT MEAN YOU ARE DEFINITELY NEGATIVE. MOST ACCURATE TESTING IS DONE 5-7 DAYS AFTER EXPOSURE.   It is not advised by CDC to get re-tested  after receiving a positive COVID test since you can still test positive for weeks to months after you have already cleared the virus.   *If you have not been vaccinated for COVID, I strongly suggest you consider getting vaccinated as long as there are no contraindications.      ED Prescriptions    None     PDMP not reviewed this encounter.   Shirlee Latch, PA-C 06/04/20 1959

## 2020-06-04 NOTE — Discharge Instructions (Signed)

## 2020-06-05 LAB — SARS CORONAVIRUS 2 (TAT 6-24 HRS): SARS Coronavirus 2: NEGATIVE

## 2020-06-26 ENCOUNTER — Other Ambulatory Visit: Payer: Self-pay

## 2020-06-26 ENCOUNTER — Ambulatory Visit
Admission: EM | Admit: 2020-06-26 | Discharge: 2020-06-26 | Disposition: A | Discharge status: Home / Self Care | Payer: Medicaid Other | Attending: Emergency Medicine | Admitting: Emergency Medicine

## 2020-06-26 DIAGNOSIS — R519 Headache, unspecified: Secondary | ICD-10-CM

## 2020-06-26 DIAGNOSIS — R509 Fever, unspecified: Secondary | ICD-10-CM | POA: Diagnosis present

## 2020-06-26 DIAGNOSIS — R42 Dizziness and giddiness: Secondary | ICD-10-CM | POA: Insufficient documentation

## 2020-06-26 DIAGNOSIS — G43909 Migraine, unspecified, not intractable, without status migrainosus: Secondary | ICD-10-CM | POA: Insufficient documentation

## 2020-06-26 DIAGNOSIS — R52 Pain, unspecified: Secondary | ICD-10-CM

## 2020-06-26 DIAGNOSIS — Z20822 Contact with and (suspected) exposure to covid-19: Secondary | ICD-10-CM | POA: Insufficient documentation

## 2020-06-26 DIAGNOSIS — M542 Cervicalgia: Secondary | ICD-10-CM | POA: Insufficient documentation

## 2020-06-26 LAB — INFLUENZA A AND B ANTIGEN (CONVERTED LAB)
INFLUENZA A ANTIGEN, POC: NEGATIVE
INFLUENZA B ANTIGEN, POC: NEGATIVE

## 2020-06-26 LAB — POCT PREGNANCY, URINE: Preg Test, Ur: NEGATIVE

## 2020-06-26 LAB — POC SARS CORONAVIRUS 2 AG: SARSCOV2ONAVIRUS 2 AG: NEGATIVE

## 2020-06-26 MED ORDER — KETOROLAC TROMETHAMINE 60 MG/2ML IM SOLN
60.0000 mg | Freq: Once | INTRAMUSCULAR | Status: AC
  Administered 2020-06-26: 60 mg via INTRAMUSCULAR

## 2020-06-26 NOTE — ED Provider Notes (Signed)
[facility]    CSN: [id number] Arrival date & time: 06/26/20  1442      History   Chief Complaint Chief Complaint  Patient presents with  . Generalized Body Aches    HPI Cathy Ortiz is a 30 y.o. female.   Cathy Ortiz presents with complaints of acute onset of headache, fever, body aches and dizziness which started today around 5 hours ago. No URI symptoms. No ear pain or sore throat. No cough. No gi symptoms. She has sensation like her left hand is falling asleep. Neck soreness, this is not new for her, however. No known ill contacts. She took tylenol which has helped bring her temperature down some, it was >101, but has mi nimally helped with her headache. History of migraines but none recently. She is actively trying for pregnancy, has not yet missed a period.      Past Medical History:  Diagnosis Date  . BV (bacterial vaginosis)    Recurrent  . History of chlamydia   . Seasonal allergies     Patient Active Problem List   Diagnosis Date Noted  . Supervision of low-risk pregnancy, first trimester 11/14/2019  . PMDD (premenstrual dysphoric disorder) 10/24/2018  . Left shoulder pain 03/06/2018  . Depressive disorder 08/08/2017  . Prediabetes 08/08/2017  . Herpes simplex 05/28/2017  . Breast nodule 01/20/2014  . Heart murmur on physical examination 01/20/2014  . Migraines 01/20/2014    Past Surgical History:  Procedure Laterality Date  . WISDOM TOOTH EXTRACTION  2016/2017   ALL FOUR    OB History    Gravida  3   Para  2   Term  2   Preterm  0   AB  0   Living  2     SAB  0   IAB  0   Ectopic  0   Multiple  0   Live Births  2            Home Medications    Prior to Admission medications   Medication Sig Start Date End Date Taking? Authorizing Provider  cetirizine (ZYRTEC) 10 MG tablet Take 10 mg by mouth daily.   Yes [provider]  cholecalciferol (VITAMIN D3) 25 MCG (1000 UNIT) tablet Take 1,000 Units by mouth  daily.   Yes [provider]  fluticasone (FLONASE) 50 MCG/ACT nasal spray Place into both nostrils daily.   Yes [provider]  Multiple Vitamin (MULTI-VITAMIN) tablet Take 1 tablet by mouth daily.   Yes [provider]  valACYclovir (VALTREX) 500 MG tablet Take 500 mg by mouth daily. 01/23/20  Yes [provider]  sertraline (ZOLOFT) 100 MG tablet Take by mouth. 01/29/20 01/28/21  [provider]  levonorgestrel (MIRENA) 20 MCG/24HR IUD 1 each by Intrauterine route once.  03/09/19  [provider]    Family History Family History  Problem Relation Age of Onset  . Healthy Mother   . Healthy Father   . Cancer Maternal Grandmother 73       LUNG  . Breast cancer Paternal Grandmother 71    Social History Social History   Tobacco Use  . Smoking status: Never Smoker  . Smokeless tobacco: Never Used  Vaping Use  . Vaping Use: Never used  Substance Use Topics  . Alcohol use: Not Currently  . Drug use: Never     Allergies   Patient has no known allergies.   Review of Systems Review of Systems   Physical Exam  Triage Vital Signs ED Triage Vitals  Enc Vitals Group     BP 06/26/20 1453 131/67     Pulse Rate 06/26/20 1453 (!) 114     Resp 06/26/20 1453 18     Temp 06/26/20 1453 (!) 100.7 F (38.2 C)     Temp Source 06/26/20 1453 Oral     SpO2 06/26/20 1453 95 %     Weight 06/26/20 1452 147 lb (66.7 kg)     Height 06/26/20 1452 5\' 1"  (1.549 m)     Head Circumference --      Peak Flow --      Pain Score 06/26/20 1452 7     Pain Loc --      Pain Edu? --      Excl. in GC? --    No data found.  Updated Vital Signs BP 131/67 (BP Location: Left Arm)   Pulse (!) 114   Temp (!) 100.7 F (38.2 C) (Oral)   Resp 18   Ht 5\' 1"  (1.549 m)   Wt 147 lb (66.7 kg)   LMP 06/02/2020   SpO2 95%   BMI 27.78 kg/m   Visual Acuity Right Eye Distance:   Left Eye Distance:   Bilateral Distance:    Right Eye Near:   Left Eye  Near:    Bilateral Near:     Physical Exam Constitutional:      General: She is not in acute distress.    Appearance: She is well-developed.  HENT:     Head: Normocephalic and atraumatic.     Right Ear: Tympanic membrane normal.     Left Ear: Tympanic membrane normal.     Mouth/Throat:     Mouth: Mucous membranes are moist.     Pharynx: Oropharynx is clear.  Eyes:     Conjunctiva/sclera: Conjunctivae normal.     Pupils: Pupils are equal, round, and reactive to light.  Cardiovascular:     Rate and Rhythm: Regular rhythm. Tachycardia present.  Pulmonary:     Effort: Pulmonary effort is normal.     Breath sounds: Normal breath sounds.  Abdominal:     Tenderness: There is no abdominal tenderness.  Musculoskeletal:        General: Normal range of motion.  Skin:    General: Skin is warm and dry.  Neurological:     General: No focal deficit present.     Mental Status: She is alert and oriented to person, place, and time.  Psychiatric:        Mood and Affect: Mood normal.        Behavior: Behavior normal.      UC Treatments / Results  Labs (all labs ordered are listed, but only abnormal results are displayed) Labs Reviewed  SARS CORONAVIRUS 2 (TAT 6-24 HRS)  INFLUENZA A AND B ANTIGEN (CONVERTED LAB)  POC INFLUENZA A AND B ANTIGEN (URGENT CARE ONLY)  POC URINE PREG, ED  POCT PREGNANCY, URINE  POC SARS CORONAVIRUS 2 AG -  ED  POC SARS CORONAVIRUS 2 AG    EKG   Radiology No results found.  Procedures Procedures (including critical care time)  Medications Ordered in UC Medications  ketorolac (TORADOL) injection 60 mg (60 mg Intramuscular Given 06/26/20 1528)    Initial Impression / Assessment and Plan / UC Course  I have reviewed the triage vital signs and the nursing notes.  Pertinent labs & imaging results that were available during my care of the patient were reviewed by  me and considered in my medical decision making (see chart for details).     Fever,  tachycardia, headache, body aches, onset today. No red flag findings on exam here today. Subjective improvement with toradol IM in clinic. Negative rapid covid and influenza testing currently. No gi symptoms. No uri symptoms. No mental status or neurological findings. History of migraines. Viral etiology at this time suspected with strict return precautions provided. Patient verbalized understanding and agreeable to plan.  Ambulatory out of clinic without difficulty.    Final Clinical Impressions(s) / UC Diagnoses   Final diagnoses:  Febrile illness  Body aches  Acute nonintractable headache, unspecified headache type     Discharge Instructions     Your rapid flu and covid testing are negative today.  Your exam is overall non specific.  At this point I still feel like your symptoms are still viral in nature.  However, given that symptoms started today, it may still be too early for all symptoms to have presented.  Rest, fluids, tylenol and motrin as needed for fevers and pain. Don't take ibuprofen for another 8 hours.  If worsening of symptoms- worse headache, weakness, neck pain, confusion, or otherwise worsening, please go to the ER.  Your pregnancy test is negative today, but it still may be too early to get a positive test so retest if missed period.     ED Prescriptions    None     PDMP not reviewed this encounter.   Georgetta Haber, NP 06/26/20 1936

## 2020-06-26 NOTE — ED Triage Notes (Signed)
Patient states that she has been having body aches, headaches and fevers, dizziness with sudden onset at 10am. States that fevers have been as high as 102.

## 2020-06-26 NOTE — Discharge Instructions (Signed)
Your rapid flu and covid testing are negative today.  Your exam is overall non specific.  At this point I still feel like your symptoms are still viral in nature.  However, given that symptoms started today, it may still be too early for all symptoms to have presented.  Rest, fluids, tylenol and motrin as needed for fevers and pain. Don't take ibuprofen for another 8 hours.  If worsening of symptoms- worse headache, weakness, neck pain, confusion, or otherwise worsening, please go to the ER.  Your pregnancy test is negative today, but it still may be too early to get a positive test so retest if missed period.

## 2020-06-27 LAB — SARS CORONAVIRUS 2 (TAT 6-24 HRS): SARS Coronavirus 2: NEGATIVE

## 2020-09-02 ENCOUNTER — Encounter: Payer: Self-pay | Admitting: Emergency Medicine

## 2020-09-02 ENCOUNTER — Other Ambulatory Visit: Payer: Self-pay

## 2020-09-02 ENCOUNTER — Emergency Department
Admission: EM | Admit: 2020-09-02 | Discharge: 2020-09-02 | Disposition: A | Discharge status: Home / Self Care | Payer: Medicaid Other | Attending: Emergency Medicine | Admitting: Emergency Medicine

## 2020-09-02 ENCOUNTER — Telehealth: Payer: Self-pay

## 2020-09-02 DIAGNOSIS — Z5321 Procedure and treatment not carried out due to patient leaving prior to being seen by health care provider: Secondary | ICD-10-CM | POA: Diagnosis not present

## 2020-09-02 DIAGNOSIS — O208 Other hemorrhage in early pregnancy: Secondary | ICD-10-CM | POA: Insufficient documentation

## 2020-09-02 DIAGNOSIS — Z3A01 Less than 8 weeks gestation of pregnancy: Secondary | ICD-10-CM | POA: Insufficient documentation

## 2020-09-02 LAB — HCG, QUANTITATIVE, PREGNANCY: hCG, Beta Chain, Quant, S: 7587 m[IU]/mL — ABNORMAL HIGH (ref <5)

## 2020-09-02 NOTE — ED Triage Notes (Signed)
Pt comes into the ED via POV c/o vaginal bleeding that started yesterday.  Pt is currently [redacted] weeks pregnant.  PT describes it has spotting and her only having to use a panty liner.  Pt in NAD at this time. PT also admits to cramping. Pt states this is 4th pregnancy and she has a h/o miscarriage on last pregnancy.

## 2020-09-02 NOTE — Telephone Encounter (Signed)
Pt called stating she has an New Ob appt with [place] on 09/20/20. Pt states she is experiencing cramping with blood that filled a panty liner. Pt denies recent intercourse. Pt was advised to report to the ED for evaluation. Pt states she will go to [facility].

## 2020-09-03 ENCOUNTER — Encounter: Payer: Self-pay | Admitting: Emergency Medicine

## 2020-09-03 ENCOUNTER — Emergency Department: Payer: Medicaid Other

## 2020-09-03 ENCOUNTER — Other Ambulatory Visit: Payer: Self-pay

## 2020-09-03 ENCOUNTER — Emergency Department
Admission: EM | Admit: 2020-09-03 | Discharge: 2020-09-03 | Disposition: A | Discharge status: Home / Self Care | Payer: Medicaid Other | Attending: Emergency Medicine | Admitting: Emergency Medicine

## 2020-09-03 DIAGNOSIS — Z3A01 Less than 8 weeks gestation of pregnancy: Secondary | ICD-10-CM | POA: Diagnosis not present

## 2020-09-03 DIAGNOSIS — O200 Threatened abortion: Secondary | ICD-10-CM | POA: Insufficient documentation

## 2020-09-03 DIAGNOSIS — N939 Abnormal uterine and vaginal bleeding, unspecified: Secondary | ICD-10-CM

## 2020-09-03 DIAGNOSIS — O4690 Antepartum hemorrhage, unspecified, unspecified trimester: Secondary | ICD-10-CM

## 2020-09-03 LAB — BASIC METABOLIC PANEL
Anion gap: 7 (ref 5–15)
BUN: 9 mg/dL (ref 6–20)
CO2: 24 mmol/L (ref 22–32)
Calcium: 8.8 mg/dL — ABNORMAL LOW (ref 8.9–10.3)
Chloride: 106 mmol/L (ref 98–111)
Creatinine, Ser: 0.86 mg/dL (ref 0.44–1.00)
GFR, Estimated: >60 mL/min (ref >60)
Glucose, Bld: 99 mg/dL (ref 70–99)
Potassium: 3.5 mmol/L (ref 3.5–5.1)
Sodium: 137 mmol/L (ref 135–145)

## 2020-09-03 LAB — CBC
HCT: 35.5 % — ABNORMAL LOW (ref 36.0–46.0)
Hemoglobin: 12.1 g/dL (ref 12.0–15.0)
MCH: 29.3 pg (ref 26.0–34.0)
MCHC: 34.1 g/dL (ref 30.0–36.0)
MCV: 86 fL (ref 80.0–100.0)
Platelets: 264 10*3/uL (ref 150–400)
RBC: 4.13 MIL/uL (ref 3.87–5.11)
RDW: 12.9 % (ref 11.5–15.5)
WBC: 6.2 10*3/uL (ref 4.0–10.5)
nRBC: 0 % (ref 0.0–0.2)

## 2020-09-03 LAB — ABO/RH: ABO/RH(D): B POS

## 2020-09-03 LAB — HCG, QUANTITATIVE, PREGNANCY: hCG, Beta Chain, Quant, S: 8534 m[IU]/mL — ABNORMAL HIGH (ref <5)

## 2020-09-03 NOTE — ED Triage Notes (Signed)
Pt comes into the ED via POV c/o vaginal bleeding that started 2 days ago.  Pt currently about [redacted] weeks pregnant.  Pt seen yesterday but LWBS after triage.

## 2020-09-03 NOTE — ED Provider Notes (Signed)
[facility] Emergency Department Provider Note   ____________________________________________   Event Date/Time   First MD Initiated Contact with Patient 09/03/20 1023     (approximate)  I have reviewed the triage vital signs and the nursing notes.   HISTORY  Chief Complaint Vaginal Bleeding    HPI Cathy Ortiz is a 30 y.o. female, [id number] at approximately 5 weeks of pregnancy, with past medical history of migraines who presents to the ED complaining of vaginal bleeding.  Patient reports that she has had 3 days of intermittent spotting, states she only has to change a panty liner once per day with no passage of clots or tissue.  She has had intermittent cramping in her lower abdomen, denies any pain currently.  She denies any fevers, dysuria, hematuria, or flank pain.  She has not yet seen an OB/GYN or had an ultrasound this pregnancy.        Past Medical History:  Diagnosis Date   BV (bacterial vaginosis)    Recurrent   History of chlamydia    Seasonal allergies     Patient Active Problem List   Diagnosis Date Noted   Supervision of low-risk pregnancy, first trimester 11/14/2019   PMDD (premenstrual dysphoric disorder) 10/24/2018   Left shoulder pain 03/06/2018   Depressive disorder 08/08/2017   Prediabetes 08/08/2017   Herpes simplex 05/28/2017   Breast nodule 01/20/2014   Heart murmur on physical examination 01/20/2014   Migraines 01/20/2014    Past Surgical History:  Procedure Laterality Date   WISDOM TOOTH EXTRACTION  2016/2017   ALL FOUR    Prior to Admission medications   Medication Sig Start Date End Date Taking? Authorizing Provider  cetirizine (ZYRTEC) 10 MG tablet Take 10 mg by mouth daily.    [provider]  cholecalciferol (VITAMIN D3) 25 MCG (1000 UNIT) tablet Take 1,000 Units by mouth daily.    [provider]  fluticasone (FLONASE) 50 MCG/ACT nasal spray Place into both nostrils daily.    [provider]  Multiple Vitamin (MULTI-VITAMIN) tablet Take 1 tablet by mouth daily.    [provider]  sertraline (ZOLOFT) 100 MG tablet Take by mouth. 01/29/20 01/28/21  [provider]  valACYclovir (VALTREX) 500 MG tablet Take 500 mg by mouth daily. 01/23/20   [provider]  levonorgestrel (MIRENA) 20 MCG/24HR IUD 1 each by Intrauterine route once.  03/09/19  [provider]    Allergies Patient has no known allergies.  Family History  Problem Relation Age of Onset   Healthy Mother    Healthy Father    Cancer Maternal Grandmother 71       LUNG   Breast cancer Paternal Grandmother 65    Social History Social History   Tobacco Use   Smoking status: Never   Smokeless tobacco: Never  Vaping Use   Vaping Use: Never used  Substance Use Topics   Alcohol use: Not Currently   Drug use: Never    Review of Systems  Constitutional: No fever/chills Eyes: No visual changes. ENT: No sore throat. Cardiovascular: Denies chest pain. Respiratory: Denies shortness of breath. Gastrointestinal: Positive for abdominal pain.  No nausea, no vomiting.  No diarrhea.  No constipation. Genitourinary: Negative for dysuria.  Positive for vaginal bleeding. Musculoskeletal: Negative for back pain. Skin: Negative for rash. Neurological: Negative for headaches, focal weakness or numbness.  ____________________________________________   PHYSICAL EXAM:  VITAL SIGNS: ED Triage Vitals  Enc Vitals Group     BP 09/03/20  0933 122/62     Pulse Rate 09/03/20 0933 85     Resp 09/03/20 0933 16     Temp 09/03/20 0933 98.8 F (37.1 C)     Temp Source 09/03/20 0933 Oral     SpO2 09/03/20 0933 100 %     Weight 09/03/20 0930 151 lb 0.2 oz (68.5 kg)     Height 09/03/20 0930 5\' 1"  (1.549 m)     Head Circumference --      Peak Flow --      Pain Score 09/03/20 0930 3     Pain Loc --      Pain Edu? --      Excl. in GC? --     Constitutional: Alert and  oriented. Eyes: Conjunctivae are normal. Head: Atraumatic. Nose: No congestion/rhinnorhea. Mouth/Throat: Mucous membranes are moist. Neck: Normal ROM Cardiovascular: Normal rate, regular rhythm. Grossly normal heart sounds.  2+ radial pulses bilaterally. Respiratory: Normal respiratory effort.  No retractions. Lungs CTAB. Gastrointestinal: Soft and nontender. No distention. Genitourinary: deferred Musculoskeletal: No lower extremity tenderness nor edema. Neurologic:  Normal speech and language. No gross focal neurologic deficits are appreciated. Skin:  Skin is warm, dry and intact. No rash noted. Psychiatric: Mood and affect are normal. Speech and behavior are normal.  ____________________________________________   LABS (all labs ordered are listed, but only abnormal results are displayed)  Labs Reviewed  CBC - Abnormal; Notable for the following components:      Result Value   HCT 35.5 (*)    All other components within normal limits  HCG, QUANTITATIVE, PREGNANCY - Abnormal; Notable for the following components:   hCG, Beta Chain, Quant, S 8,534 (*)    All other components within normal limits  BASIC METABOLIC PANEL - Abnormal; Notable for the following components:   Calcium 8.8 (*)    All other components within normal limits  ABO/RH    PROCEDURES  Procedure(s) performed (including Critical Care):  Procedures   ____________________________________________   INITIAL IMPRESSION / ASSESSMENT AND PLAN / ED COURSE      30 year old female, 38 at approximately 5 weeks of pregnancy who presents to the ED complaining of intermittent abdominal pain as well as light vaginal bleeding for the past 3 days.  She left without being seen from the ED yesterday, had hormone levels drawn at that time consistent with early pregnancy, we will trend today and check ultrasound.  Labs are unremarkable, patient is Rh+ and there is no indication for RhoGAM.  Ultrasound shows likely  intrauterine gestational sac but no yolk sac identified.  Patient with minimal pain or bleeding at this time and low suspicion for ectopic pregnancy, although she will need follow-up ultrasound and hCG in 1 to 2 weeks.  She was counseled to schedule an appointment with OB/GYN and to return to the ED for new or worsening symptoms, patient agrees with plan.      ____________________________________________   FINAL CLINICAL IMPRESSION(S) / ED DIAGNOSES  Final diagnoses:  Vaginal bleeding in pregnancy  Threatened miscarriage     ED Discharge Orders     None        Note:  This document was prepared using Dragon voice recognition software and may include unintentional dictation errors.    [id number], MD 09/03/20 [phone]

## 2020-09-03 NOTE — ED Notes (Signed)
Pt in [place].  [place] notified to take patient to room 16 when done.

## 2020-09-07 ENCOUNTER — Other Ambulatory Visit: Payer: Self-pay

## 2020-09-07 ENCOUNTER — Ambulatory Visit (INDEPENDENT_AMBULATORY_CARE_PROVIDER_SITE_OTHER): Payer: Medicaid Other | Admitting: Obstetrics and Gynecology

## 2020-09-07 ENCOUNTER — Telehealth: Payer: Self-pay

## 2020-09-07 ENCOUNTER — Encounter: Payer: Self-pay | Admitting: Obstetrics and Gynecology

## 2020-09-07 VITALS — BP 114/72 | Ht 61.0 in | Wt 155.0 lb

## 2020-09-07 DIAGNOSIS — N96 Recurrent pregnancy loss: Secondary | ICD-10-CM

## 2020-09-07 DIAGNOSIS — O200 Threatened abortion: Secondary | ICD-10-CM | POA: Diagnosis not present

## 2020-09-07 MED ORDER — PROGESTERONE 200 MG VA SUPP: 200.0000 mg | Freq: Every day | VAGINAL | 6 refills | Status: DC

## 2020-09-07 NOTE — Progress Notes (Signed)
Patient ID: Cathy Ortiz, female   DOB: 1990/11/16, 30 y.o.   MRN: [id number]  Reason for Consult: Routine Prenatal Visit   Referred by Su Monks, PA  Subjective:     HPI:  Cathy Ortiz is a 30 y.o. female. She is following upom the ER for a threatened abortion. She repoert her LMP in 07/28/2020. She has had two prior miscarriages in April of 2021 at 5 weeks and November 2021 at 9 weeks.   Gynecological History  Patient's last menstrual period was 07/28/2020 (exact date).  Past Medical History:  Diagnosis Date   BV (bacterial vaginosis)    Recurrent   History of chlamydia    Seasonal allergies    Family History  Problem Relation Age of Onset   Healthy Mother    Healthy Father    Cancer Maternal Grandmother 61       LUNG   Breast cancer Paternal Grandmother 46   Past Surgical History:  Procedure Laterality Date   WISDOM TOOTH EXTRACTION  2016/2017   ALL FOUR    Short Social History:  Social History   Tobacco Use   Smoking status: Never   Smokeless tobacco: Never  Substance Use Topics   Alcohol use: Not Currently    No Known Allergies  Current Outpatient Medications  Medication Sig Dispense Refill   cetirizine (ZYRTEC) 10 MG tablet Take 10 mg by mouth daily.     cholecalciferol (VITAMIN D3) 25 MCG (1000 UNIT) tablet Take 1,000 Units by mouth daily. (Patient not taking: Reported on 10/06/2020)     Multiple Vitamin (MULTI-VITAMIN) tablet Take 1 tablet by mouth daily.     progesterone 200 MG SUPP Place 1 suppository (200 mg total) vaginally at bedtime. (Patient not taking: No sig reported) 30 suppository 6   Calcium Carb-Cholecalciferol 600-400 MG-UNIT TABS Take by mouth.     fluticasone (FLONASE) 50 MCG/ACT nasal spray Place into both nostrils daily. (Patient not taking: No sig reported)     sertraline (ZOLOFT) 100 MG tablet Take by mouth. (Patient not taking: No sig reported)     valACYclovir (VALTREX) 500 MG tablet Take 500 mg by mouth daily.  (Patient not taking: No sig reported)     No current facility-administered medications for this visit.    Review of Systems  Constitutional: Negative for chills, fatigue, fever and unexpected weight change.  HENT: Negative for trouble swallowing.  Eyes: Negative for loss of vision.  Respiratory: Negative for cough, shortness of breath and wheezing.  Cardiovascular: Negative for chest pain, leg swelling, palpitations and syncope.  GI: Negative for abdominal pain, blood in stool, diarrhea, nausea and vomiting.  GU: Negative for difficulty urinating, dysuria, frequency and hematuria.  Musculoskeletal: Negative for back pain, leg pain and joint pain.  Skin: Negative for rash.  Neurological: Negative for dizziness, headaches, light-headedness, numbness and seizures.  Psychiatric: Negative for behavioral problem, confusion, depressed mood and sleep disturbance.       Objective:  Objective   Vitals:   09/07/20 1004  BP: 114/72  Weight: 155 lb (70.3 kg)  Height: 5\' 1"  (1.549 m)   Body mass index is 29.29 kg/m.  Physical Exam Vitals and nursing note reviewed. Exam conducted with a chaperone present.  Constitutional:      Appearance: Normal appearance.  HENT:     Head: Normocephalic and atraumatic.  Eyes:     Extraocular Movements: Extraocular movements intact.     Pupils: Pupils are equal, round, and reactive to light.  Cardiovascular:  Rate and Rhythm: Normal rate and regular rhythm.  Pulmonary:     Effort: Pulmonary effort is normal.     Breath sounds: Normal breath sounds.  Abdominal:     General: Abdomen is flat.     Palpations: Abdomen is soft.  Musculoskeletal:     Cervical back: Normal range of motion.  Skin:    General: Skin is warm and dry.  Neurological:     General: No focal deficit present.     Mental Status: She is alert and oriented to person, place, and time.  Psychiatric:        Behavior: Behavior normal.        Thought Content: Thought content  normal.        Judgment: Judgment normal.    Assessment/Plan:     30 yo with threatened miscarriage Discussed follow up for viability [place] in 2 weeks.  Will start vaginal progesterone and continue through 12 weeks of pregnancy.  Patient desires to check beta hcg today. Lab ordered.   More than 20 minutes were spent face to face with the patient in the room, reviewing the medical record, labs and images, and coordinating care for the patient. The plan of management was discussed in detail and counseling was provided.      Adelene Idler MD [facility], [facility] 10/12/2020 1:34 PM

## 2020-09-07 NOTE — Telephone Encounter (Signed)
[facility] [place] calling about progesterone suppositories; they do no make them; rx needs to go to a compounding pharm.  [phone]

## 2020-09-07 NOTE — Patient Instructions (Signed)
Threatened Miscarriage A threatened miscarriage is when a woman bleeds in her vagina during the first 20 weeks of pregnancy but the pregnancy has not ended. The doctor will do tests to make sure you are still pregnant. This condition does not mean your pregnancy will end, but it does increase the risk that it will end (miscarriage). What are the causes? Normally, the cause of this condition is not known. What increases the risk? These things may make a pregnant woman more likely to lose a pregnancy: Certain health problems Conditions that affect hormones, such as thyroid disease or polycystic ovary syndrome. Diabetes. Disorders that cause the body's disease-fighting system to attack itself by mistake. Infections. Bleeding problems. Being very overweight. Lifestyle factors Using products that have tobacco or nicotine in them. Being around tobacco smoke. Having a lot of caffeine. Using drugs. Problems with reproductive organs or parts Having a cervix that opens and thins before you are ready to give birth. The cervix is the lowest part of the womb. Having Asherman syndrome, which leads to: Scars in the womb. The womb being an abnormal shape. Growths (fibroids) in the womb. Problems in the body that are present at birth. Infection of the cervix or womb. Personal or health history Injury. Having lost an unborn baby before. Being younger than age 18 or older than age 35. Being around a harmful substance, such as radiation. Having lead or other heavy metals in: Things you eat or drink. The air around you. Using certain medicines. What are the signs or symptoms? Bleeding from the vagina. You may also have cramps or pain. Mild pain or cramps in your belly. How is this diagnosed?  The doctor will do tests, such as an ultrasound to make sure you are still pregnant. How is this treated? There are no treatments that prevent loss of pregnancy. But you need to do the right things to take  care of yourself at home. Follow these instructions at home: Get a lot of rest. Do not have sex or douche if there is bleeding in the vagina. Do not put things such as tampons in the vagina if it is bleeding. Do not smoke or use drugs. Do not drink alcohol. Avoid caffeine. Keep all follow-up visits while you are pregnant. Contact a doctor if: You are pregnant and you have one of these: Light bleeding coming from your vagina. Spots of blood coming from your vagina. You have belly pain or cramping. You have a fever. Get help right away if: Blood soaks through 2 large pads an hour for more than 2 hours. Clots of blood come from your vagina. Tissue comes out of your vagina. Fluid leaks or gushes from your vagina. You have very bad pain in your low back. You have very bad cramps in your belly. You have a fever, chills, and very bad belly pain. Summary A threatened miscarriage is when a woman bleeds in her vagina during the first 20 weeks of pregnancy but the pregnancy has not ended. Normally, the cause of this condition is not known. Symptoms include bleeding in the vagina or mild pain or cramps in your belly. There are no treatments that prevent loss of pregnancy. Keep all follow-up visits while you are pregnant. This information is not intended to replace advice given to you by your health care provider. Make sure you discuss any questions you have with your health care provider. Document Revised: 07/11/2019 Document Reviewed: 07/11/2019 Elsevier Patient Education  2022 [employer]  

## 2020-09-08 LAB — BETA HCG QUANT (REF LAB): hCG Quant: 21514 m[IU]/mL

## 2020-09-17 ENCOUNTER — Ambulatory Visit (HOSPITAL_BASED_OUTPATIENT_CLINIC_OR_DEPARTMENT_OTHER): Payer: Medicaid Other

## 2020-09-20 ENCOUNTER — Ambulatory Visit: Payer: Medicaid Other | Admitting: Obstetrics and Gynecology

## 2020-09-22 ENCOUNTER — Other Ambulatory Visit: Payer: Self-pay | Admitting: Obstetrics and Gynecology

## 2020-09-22 ENCOUNTER — Other Ambulatory Visit: Payer: Self-pay

## 2020-09-22 ENCOUNTER — Other Ambulatory Visit (HOSPITAL_BASED_OUTPATIENT_CLINIC_OR_DEPARTMENT_OTHER): Payer: Self-pay | Admitting: Obstetrics and Gynecology

## 2020-09-22 ENCOUNTER — Ambulatory Visit (HOSPITAL_BASED_OUTPATIENT_CLINIC_OR_DEPARTMENT_OTHER)
Admission: RE | Admit: 2020-09-22 | Discharge: 2020-09-22 | Disposition: A | Discharge status: Home / Self Care | Payer: Medicaid Other | Source: Ambulatory Visit | Attending: Obstetrics and Gynecology | Admitting: Obstetrics and Gynecology

## 2020-09-22 DIAGNOSIS — O200 Threatened abortion: Secondary | ICD-10-CM

## 2020-09-23 ENCOUNTER — Other Ambulatory Visit (HOSPITAL_COMMUNITY)
Admission: RE | Admit: 2020-09-23 | Discharge: 2020-09-23 | Disposition: A | Discharge status: Home / Self Care | Payer: Medicaid Other | Source: Ambulatory Visit | Attending: Obstetrics and Gynecology | Admitting: Obstetrics and Gynecology

## 2020-09-23 ENCOUNTER — Ambulatory Visit (INDEPENDENT_AMBULATORY_CARE_PROVIDER_SITE_OTHER): Payer: Medicaid Other | Admitting: Obstetrics and Gynecology

## 2020-09-23 ENCOUNTER — Encounter: Payer: Self-pay | Admitting: Obstetrics and Gynecology

## 2020-09-23 VITALS — BP 116/70 | Ht 61.0 in | Wt 158.2 lb

## 2020-09-23 VITALS — BP 116/70 | Wt 158.2 lb

## 2020-09-23 DIAGNOSIS — Z3481 Encounter for supervision of other normal pregnancy, first trimester: Secondary | ICD-10-CM | POA: Diagnosis not present

## 2020-09-23 DIAGNOSIS — Z3491 Encounter for supervision of normal pregnancy, unspecified, first trimester: Secondary | ICD-10-CM

## 2020-09-23 DIAGNOSIS — Z3A08 8 weeks gestation of pregnancy: Secondary | ICD-10-CM

## 2020-09-23 NOTE — Patient Instructions (Signed)
First Trimester of Pregnancy The first trimester of pregnancy starts on the first day of your last menstrual period until the end of week 12. This is months 1 through 3 of pregnancy. A week after a sperm fertilizes an egg, the egg will implant into the wall of the uterus and begin to develop into a baby. By the end of 12 weeks, all the baby's organs will be formed and the baby will be 2-3 inches in size. Body changes during your first trimester Your body goes through many changes during pregnancy. The changes vary and generally return to normal after your baby is born. Physical changes You may gain or lose weight. Your breasts may begin to grow larger and become tender. The tissue that surrounds your nipples (areola) may become darker. Dark spots or blotches (chloasma or mask of pregnancy) may develop on your face. You may have changes in your hair. These can include thickening or thinning of your hair or changes in texture. Health changes You may feel nauseous, and you may vomit. You may have heartburn. You may develop headaches. You may develop constipation. Your gums may bleed and may be sensitive to brushing and flossing. Other changes You may tire easily. You may urinate more often. Your menstrual periods will stop. You may have a loss of appetite. You may develop cravings for certain kinds of food. You may have changes in your emotions from day to day. You may have more vivid and strange dreams. Follow these instructions at home: Medicines Follow your health care provider's instructions regarding medicine use. Specific medicines may be either safe or unsafe to take during pregnancy. Do not take any medicines unless told to by your health care provider. Take a prenatal vitamin that contains at least 600 micrograms (mcg) of folic acid. Eating and drinking Eat a healthy diet that includes fresh fruits and vegetables, whole grains, good sources of protein such as meat, eggs, or tofu,  and low-fat dairy products. Avoid raw meat and unpasteurized juice, milk, and cheese. These carry germs that can harm you and your baby. If you feel nauseous or you vomit: Eat 4 or 5 small meals a day instead of 3 large meals. Try eating a few soda crackers. Drink liquids between meals instead of during meals. You may need to take these actions to prevent or treat constipation: Drink enough fluid to keep your urine pale yellow. Eat foods that are high in fiber, such as beans, whole grains, and fresh fruits and vegetables. Limit foods that are high in fat and processed sugars, such as fried or sweet foods. Activity Exercise only as directed by your health care provider. Most people can continue their usual exercise routine during pregnancy. Try to exercise for 30 minutes at least 5 days a week. Stop exercising if you develop pain or cramping in the lower abdomen or lower back. Avoid exercising if it is very hot or humid or if you are at high altitude. Avoid heavy lifting. If you choose to, you may have sex unless your health care provider tells you not to. Relieving pain and discomfort Wear a good support bra to relieve breast tenderness. Rest with your legs elevated if you have leg cramps or low back pain. If you develop bulging veins (varicose veins) in your legs: Wear support hose as told by your health care provider. Elevate your feet for 15 minutes, 3-4 times a day. Limit salt in your diet. Safety Wear your seat belt at all times when driving  or riding in a car. Talk with your health care provider if someone is verbally or physically abusive to you. Talk with your health care provider if you are feeling sad or have thoughts of hurting yourself. Lifestyle Do not use hot tubs, steam rooms, or saunas. Do not douche. Do not use tampons or scented sanitary pads. Do not use herbal remedies, alcohol, illegal drugs, or medicines that are not approved by your health care provider. Chemicals  in these products can harm your baby. Do not use any products that contain nicotine or tobacco, such as cigarettes, e-cigarettes, and chewing tobacco. If you need help quitting, ask your health care provider. Avoid cat litter boxes and soil used by cats. These carry germs that can cause birth defects in the baby and possibly loss of the unborn baby (fetus) by miscarriage or stillbirth. General instructions During routine prenatal visits in the first trimester, your health care provider will do a physical exam, perform necessary tests, and ask you how things are going. Keep all follow-up visits. This is important. Ask for help if you have counseling or nutritional needs during pregnancy. Your health care provider can offer advice or refer you to specialists for help with various needs. Schedule a dentist appointment. At home, brush your teeth with a soft toothbrush. Floss gently. Write down your questions. Take them to your prenatal visits. Where to find more information American Pregnancy Association: americanpregnancy.Azure and Gynecologists: PoolDevices.com.pt Office on [employer]: KeywordPortfolios.com.br Contact a health care provider if you have: Dizziness. A fever. Mild pelvic cramps, pelvic pressure, or nagging pain in the abdominal area. Nausea, vomiting, or diarrhea that lasts for 24 hours or longer. A bad-smelling vaginal discharge. Pain when you urinate. Known exposure to a contagious illness, such as chickenpox, measles, Zika virus, HIV, or hepatitis. Get help right away if you have: Spotting or bleeding from your vagina. Severe abdominal cramping or pain. Shortness of breath or chest pain. Any kind of trauma, such as from a fall or a car crash. New or increased pain, swelling, or redness in an arm or leg. Summary The first trimester of pregnancy starts on the first day of your last menstrual period until the end of week  12 (months 1 through 3). Eating 4 or 5 small meals a day rather than 3 large meals may help to relieve nausea and vomiting. Do not use any products that contain nicotine or tobacco, such as cigarettes, e-cigarettes, and chewing tobacco. If you need help quitting, ask your health care provider. Keep all follow-up visits. This is important. This information is not intended to replace advice given to you by your health care provider. Make sure you discuss any questions you have with your health care provider. Document Revised: 06/18/2019 Document Reviewed: 04/24/2019 Elsevier Patient Education  2022 [place]  SAFE IN PREGNANCY   COMPLAINT                                        MEDICINE                                                 Constipation Add fiber supplements such as Metamucil, Fibercon, or Citrucel.  Increasing fiber intake via  bran cereals, oatmeal, leafy greens, and prunes is another alternative.  Increase you fluid intake.  You may also add Colace 185m by mouth twice daily or Senakot   Cough/Cold      Robitusin, Mucinex  Cuts and  Scrapes Bacitracin, Neosporin, Plysporin  Dental Pain     Tylenol. Avoid aspirin  products, which  could cause bleeding problems for mother and baby.  Also avoid ibuprofen products, such as Advil, Motrin or Aleve unless your doctor or nurse specifically recommends these drugs.  Diarrhea Immodium, Kaopectate, or Donnagel PG .  Ensure adequate hydration by taking in plenty of clear liquids such as Gatorade, ginger ale, and jello.  Call if symptoms persist over 24-hrs.  Do NOT take Pepto-Bismol  Fever Take Tylenol *, Extra-Strength Tylenol *, or any aspirin-free pain reliever (acetaminophen).  Avoid aspirin products, which could cause bleeding problems for mother and baby.  Also avoid ibuprofen products, such as Advil, Motrin or Aleve unless your doctor or nurse specifically recommends these  drugs.  If you temperature is over 101o F (38.3o C), call the  clinic  Gas    Gaviscon,, Mylicon,  Riopan    Headache   Tylenol. Avoid aspirin                                                                         products, which  could cause bleeding problems for mother and baby.  Also avoid ibuprofen products, such as Advil, Motrin or Aleve unless your doctor or nurse specifically recommends these drugs.  Head Lice     Nix  Heartburn/Indigestion TUMS, Rolaids, Zantac, Mylanta. Maalox   Avoid fatty, fried, or spicy foods.  Eat small frequent meals 5-6 times daily as opposed to 3 large meals a day.  Hemorrhoids Annusol HC, Tucks , Perperation H , or Dermaplast.  Warm sitz baths for 30 minutes twice daily.  Air dry and sleep without underwear.  Avoid constipation and see recommendation above for constipation as this may exacerbate/worsen you hemorrhoids  Leg Cramps     TUMS, Vitamin with Potassium  Leg Swelling  Elevate legs above                                                                                            waist, lay on your left side.  Well fitting  shoes and support  hose  Muscle Aches                Tylenol. Avoid aspirin products, which  could cause bleeding problems for mother and baby.  Also avoid ibuprofen products, such as Advil, Motrin or Aleve unless your doctor or nurse specifically recommends these drugs.  Nausea/Vomiting Emetrol.  Supportive measures to ensure you stay adequately hydrated.  Sips of juice, Gatorade, ginger ale 4 times an hour.  If you are unable to tolerate fluid intake for greater  than 8hrs call the clinic.  You may want to allow ginger ale to go flat as carbonation my precipitate emesis.  Over the counter chewable prenatal vitamins are available.  Vitamin B6 (pyridoxine) 10 to 229m every 8hrs and doxylamine (Unisome sleep tabs) 268mat bedtime and 12.29m62mn the morning is first line.  Note that B6 supplements are  not FDA regulated, there is a prescription of the above combination commercially available called Bonjesta.  Nosebleeds Apply ice pack to nose.  Particularly in winter time with dryer air, consider the use of a humidifier   Painful urination    Call clinic  Rash/ Itch Benadryl, Aveeno, Cortaid.  For severe whole body itching in the late second early third trimester call the office  Sleep Problems    Benadryl, Tylenol  PM, or Unisom  Seasonal Allergies    Claritin, Zyrtec,          Benadryl, Mucinex  Sinus Congestion Tylenol with Sudafed (pseudoephedrine no phenylephrine) or Actifed.  Sore Throat Chloroseptic lozenges and sprays such as Cepacol.  Salt water gargles (1 teaspoon of table salt in one quart of water)  Call if associated temperature above 101 Fahrenheit or 38.3 Celsius  Yeast Infection     Monistat    Pregnancy and Vaccinations Vaccines can help to keep you healthy. There are some vaccines that should be given before pregnancy and some that should be given during pregnancy. How does this affect me? If you are pregnant or thinking about getting pregnant, talk with your health care provider about what vaccines are right for you. How does this affect my baby? Usually, the benefits of receiving vaccines during pregnancy outweigh the risks of harm to you or your baby if: The risk of being exposed to a disease is high. Infection would pose a risk to you or your unborn baby. The vaccine is not likely to cause harm. Vaccines can help protect your baby from some diseases until he or she is old enough to get the vaccine. What can I do to lower my risk? When you receive the recommended vaccines, it helps to protect you from getting certain diseases and passing them on to your baby. Should I receive vaccines before pregnancy? If possible, make sure that your vaccines are up to date before you become pregnant. It is safe and important for you to receive weakened viral and  weakened bacterial vaccines (inactivated vaccines) as needed before you are pregnant. Live viral and live bacterial vaccines, such as the measles, mumps, and rubella (MMR) vaccine, should be given 1 month or more before pregnancy. Sometimes, women become pregnant within 1 month of receiving a live vaccine that is not usually recommended during pregnancy. The U.S. Centers for Disease Control and Prevention (CDC) has reported that when this has happened, vaccines have not harmed pregnant women or their unborn babies. Should I receive vaccines during pregnancy? It is safe and important for you to receive some inactivated vaccines as needed during pregnancy. Until your baby can receive vaccines, your baby will get some protection from diseases through the vaccines that you receive while you are pregnant. During your pregnancy, you should receive the following: Influenza vaccine (the flu shot). The flu shot may protect you and your baby (up to 6 m52nths of age) from some complications associated with strains of influenza that are covered by the vaccine. Pregnant women can receive the flu shot at any time during pregnancy. Tetanus, diphtheria, and pertussis (Tdap) vaccine. The Tdap vaccine will  help to prevent whooping cough (pertussis) in you and your baby. You should receive 1 dose of this vaccine during each pregnancy. It is recommended that pregnant women receive this vaccine between 27 and 36 weeks of pregnancy. Should I receive vaccines after pregnancy? It is safe and important for you to receive vaccines as needed after pregnancy. Some are safe to have if you are breastfeeding. Other vaccines may not be safe to have until after you have stopped breastfeeding. If you did not receive the Tdap vaccine during your pregnancy and have never received a Tdap vaccine, you should receive that vaccine right after you give birth to your baby (delivery). If you are not immune to measles, mumps, rubella, or  chickenpox (varicella), you should receive those vaccines within days after delivery. It is important to talk with your health care provider about what vaccines you may need after delivery. What if I am pregnant and I plan to travel internationally? If you are pregnant and you are planning to travel internationally, talk with your health care provider at least 4-6 weeks before your trip. Depending on the country you are planning to visit, you may need to take special precautions or get certain vaccines to prevent disease. Vaccines that may be recommended for pregnant international travelers include: Influenza (the flu shot). Tetanus and diphtheria (Td) or Tdap. Hepatitis B (HepB). Hepatitis A (HepA). Your health care provider can help you decide if you need vaccines and if the benefits outweigh the risk of disease exposure. Follow these instructions at home: Take over-the-counter and prescription medicines as told by your health care provider. Keep all follow-up visits as told by your health care provider. This is important. Questions to ask your health care provider: What vaccines are safe during pregnancy? What are the risks of vaccines during pregnancy? What are the potential side effects of vaccines during or after pregnancy? When should I get vaccines during pregnancy? Contact a health care provider if you: Believe you have had a reaction to a vaccine. Have concerns or questions about a vaccine. Become pregnant within 1 month after you have received a live vaccine. Summary Vaccines are the most effective way to prevent certain diseases. Many vaccines are safe to receive during pregnancy. Some vaccines are recommended during pregnancy to protect you and your baby from getting sick. If you are pregnant or planning to become pregnant, talk with your health care provider about what vaccines are right for you. This information is not intended to replace advice given to you by your health  care provider. Make sure you discuss any questions you have with your health care provider. Document Revised: 06/18/2019 Document Reviewed: 02/13/2018 Elsevier Patient Education  2022 [employer].

## 2020-09-23 NOTE — Progress Notes (Signed)
Routine Prenatal Care Visit  Subjective  Cathy Ortiz is a 30 y.o. G4P2002 at [redacted]w[redacted]d being seen today for ongoing prenatal care.  She is currently monitored for the following issues for this low-risk pregnancy and has Breast nodule; Depressive disorder; Heart murmur on physical examination; Herpes simplex; Left shoulder pain; Migraines; PMDD (premenstrual dysphoric disorder); Prediabetes; and Supervision of low-risk pregnancy, first trimester on their problem list.  ----------------------------------------------------------------------------------- Patient reports no complaints.  She reports that her bleeding and spotting have largely improved.  She had a pelvic ultrasound yesterday for viability which confirmed a normal intrauterine pregnancy.  She has otherwise been feeling well. Contractions: Not present. Vag. Bleeding: None.  Movement: Absent. Denies leaking of fluid.  ----------------------------------------------------------------------------------- The following portions of the patient's history were reviewed and updated as appropriate: allergies, current medications, past family history, past medical history, past social history, past surgical history and problem list. Problem list updated.   Objective  Blood pressure 116/70, weight 158 lb 3.2 oz (71.8 kg), last menstrual period 07/28/2020. Pregravid weight Pregravid weight not on file Total Weight Gain Not found. Urinalysis:      Fetal Status:     Movement: Absent     General:  Alert, oriented and cooperative. Patient is in no acute distress.  Skin: Skin is warm and dry. No rash noted.   Cardiovascular: Normal heart rate noted  Respiratory: Normal respiratory effort, no problems with respiration noted  Abdomen: Soft, gravid, appropriate for gestational age. Pain/Pressure: Absent     Pelvic:  Cervical exam deferred        Extremities: Normal range of motion.  Edema: None  Mental Status: Normal mood and affect. Normal behavior.  Normal judgment and thought content.     Assessment   30 y.o. [id number] at [redacted]w[redacted]d by  05/04/2021, Date entered prior to episode creation presenting for routine prenatal visit  Plan   PREGANCY5 Problems (from 09/23/20 to present)     Problem Noted Resolved   Supervision of low-risk pregnancy, first trimester 11/14/2019 by Nadara Mustard, MD No   Overview Addendum 09/23/2020 11:28 AM by Natale Milch, MD     Nursing Staff Provider  Office Location  [facility] Dating   LMP = 7 wk [place]  Language  English Anatomy US    Flu Vaccine   Genetic Screen  NIPS:   TDaP vaccine    Hgb A1C or  GTT Early : Third trimester :   Covid    LAB RESULTS   Rhogam   Blood Type --/--/B POS Performed at [facility], [address] [place], [place] [address]  [phone])   Feeding Plan  Antibody    Contraception  Rubella    Circumcision  RPR     Pediatrician   HBsAg     Support Person  HIV    Prenatal Classes  Varicella     GBS  (For PCN allergy, check sensitivities)   BTL Consent     VBAC Consent  Pap  2021 NIL HPV +     Hgb Electro      CF      SMA                  New OB labs today. Will return for genetic testing in 2 weeks.  Gestational age appropriate obstetric precautions including but not limited to vaginal bleeding, contractions, leaking of fluid and fetal movement were reviewed in detail with the patient.    Return in about  2 weeks (around 10/07/2020) for ROB in person.  Natale Milch MD [facility], [facility] 09/23/2020, 12:36 PM

## 2020-09-23 NOTE — Patient Instructions (Signed)
OVER THE COUNTER MEDICINES  SAFE IN PREGNANCY   COMPLAINT                                        MEDICINE                                                 Constipation Add fiber supplements such as Metamucil, Fibercon, or Citrucel.  Increasing fiber intake via bran cereals, oatmeal, leafy greens, and prunes is another alternative.  Increase you fluid intake.  You may also add Colace 135m by mouth twice daily or Senakot   Cough/Cold      Robitusin, Mucinex  Cuts and  Scrapes Bacitracin, Neosporin, Plysporin  Dental Pain     Tylenol. Avoid aspirin  products, which  could cause bleeding problems for mother and baby.  Also avoid ibuprofen products, such as Advil, Motrin or Aleve unless your doctor or nurse specifically recommends these drugs.  Diarrhea Immodium, Kaopectate, or Donnagel PG .  Ensure adequate hydration by taking in plenty of clear liquids such as Gatorade, ginger ale, and jello.  Call if symptoms persist over 24-hrs.  Do NOT take Pepto-Bismol  Fever Take Tylenol *, Extra-Strength Tylenol *, or any aspirin-free pain reliever (acetaminophen).  Avoid aspirin products, which could cause bleeding problems for mother and baby.  Also avoid ibuprofen products, such as Advil, Motrin or Aleve unless your doctor or nurse specifically recommends these drugs.  If you temperature is over 101o F (38.3o C), call the  clinic  Gas    Gaviscon,, Mylicon,  Riopan    Headache   Tylenol. Avoid aspirin                                                                         products, which  could cause bleeding problems for mother and baby.  Also avoid ibuprofen products, such as Advil, Motrin or Aleve unless your doctor or nurse specifically recommends these drugs.  Head Lice     Nix  Heartburn/Indigestion TUMS, Rolaids, Zantac, Mylanta. Maalox   Avoid fatty, fried, or spicy foods.  Eat small frequent meals 5-6 times daily as opposed to 3 large meals a  day.  Hemorrhoids Annusol HC, Tucks , Perperation H , or Dermaplast.  Warm sitz baths for 30 minutes twice daily.  Air dry and sleep without underwear.  Avoid constipation and see recommendation above for constipation as this may exacerbate/worsen you hemorrhoids  Leg Cramps     TUMS, Vitamin with Potassium  Leg Swelling  Elevate legs above  waist, lay on your left side.  Well fitting  shoes and support  hose  Muscle Aches                Tylenol. Avoid aspirin products, which  could cause bleeding problems for mother and baby.  Also avoid ibuprofen products, such as Advil, Motrin or Aleve unless your doctor or nurse specifically recommends these drugs.  Nausea/Vomiting Emetrol.  Supportive measures to ensure you stay adequately hydrated.  Sips of juice, Gatorade, ginger ale 4 times an hour.  If you are unable to tolerate fluid intake for greater than 8hrs call the clinic.  You may want to allow ginger ale to go flat as carbonation my precipitate emesis.  Over the counter chewable prenatal vitamins are available.  Vitamin B6 (pyridoxine) 10 to 60m every 8hrs and doxylamine (Unisome sleep tabs) 2558mat bedtime and 12.58m44mn the morning is first line.  Note that B6 supplements are not FDA regulated, there is a prescription of the above combination commercially available called Bonjesta.  Nosebleeds Apply ice pack to nose.  Particularly in winter time with dryer air, consider the use of a humidifier   Painful urination    Call clinic  Rash/ Itch Benadryl, Aveeno, Cortaid.  For severe whole body itching in the late second early third trimester call the office  Sleep Problems    Benadryl, Tylenol  PM, or Unisom  Seasonal Allergies    Claritin, Zyrtec,          Benadryl, Mucinex  Sinus Congestion Tylenol with Sudafed (pseudoephedrine no phenylephrine) or Actifed.  Sore  Throat Chloroseptic lozenges and sprays such as Cepacol.  Salt water gargles (1 teaspoon of table salt in one quart of water)  Call if associated temperature above 101 Fahrenheit or 38.3 Celsius  Yeast Infection     Monistat     Pregnancy and Vaccinations Vaccines can help to keep you healthy. There are some vaccines that should be given before pregnancy and some that should be given during pregnancy. How does this affect me? If you are pregnant or thinking about getting pregnant, talk with your health care provider about what vaccines are right for you. How does this affect my baby? Usually, the benefits of receiving vaccines during pregnancy outweigh the risks of harm to you or your baby if: The risk of being exposed to a disease is high. Infection would pose a risk to you or your unborn baby. The vaccine is not likely to cause harm. Vaccines can help protect your baby from some diseases until he or she is old enough to get the vaccine. What can I do to lower my risk? When you receive the recommended vaccines, it helps to protect you from getting certain diseases and passing them on to your baby. Should I receive vaccines before pregnancy? If possible, make sure that your vaccines are up to date before you become pregnant. It is safe and important for you to receive weakened viral and weakened bacterial vaccines (inactivated vaccines) as needed before you are pregnant. Live viral and live bacterial vaccines, such as the measles, mumps, and rubella (MMR) vaccine, should be given 1 month or more before pregnancy. Sometimes, women become pregnant within 1 month of receiving a live vaccine that is not usually recommended during pregnancy. The [place]. Centers for Disease Control and Prevention (CDC) has reported that when this has happened, vaccines have not harmed pregnant women or their unborn babies. Should I receive vaccines during pregnancy? It is safe  and important for you to receive some  inactivated vaccines as needed during pregnancy. Until your baby can receive vaccines, your baby will get some protection from diseases through the vaccines that you receive while you are pregnant. During your pregnancy, you should receive the following: Influenza vaccine (the flu shot). The flu shot may protect you and your baby (up to 9 months of age) from some complications associated with strains of influenza that are covered by the vaccine. Pregnant women can receive the flu shot at any time during pregnancy. Tetanus, diphtheria, and pertussis (Tdap) vaccine. The Tdap vaccine will help to prevent whooping cough (pertussis) in you and your baby. You should receive 1 dose of this vaccine during each pregnancy. It is recommended that pregnant women receive this vaccine between 27 and 36 weeks of pregnancy. Should I receive vaccines after pregnancy? It is safe and important for you to receive vaccines as needed after pregnancy. Some are safe to have if you are breastfeeding. Other vaccines may not be safe to have until after you have stopped breastfeeding. If you did not receive the Tdap vaccine during your pregnancy and have never received a Tdap vaccine, you should receive that vaccine right after you give birth to your baby (delivery). If you are not immune to measles, mumps, rubella, or chickenpox (varicella), you should receive those vaccines within days after delivery. It is important to talk with your health care provider about what vaccines you may need after delivery. What if I am pregnant and I plan to travel internationally? If you are pregnant and you are planning to travel internationally, talk with your health care provider at least 4-6 weeks before your trip. Depending on the country you are planning to visit, you may need to take special precautions or get certain vaccines to prevent disease. Vaccines that may be recommended for pregnant international travelers include: Influenza  (the flu shot). Tetanus and diphtheria (Td) or Tdap. Hepatitis B (HepB). Hepatitis A (HepA). Your health care provider can help you decide if you need vaccines and if the benefits outweigh the risk of disease exposure. Follow these instructions at home: Take over-the-counter and prescription medicines as told by your health care provider. Keep all follow-up visits as told by your health care provider. This is important. Questions to ask your health care provider: What vaccines are safe during pregnancy? What are the risks of vaccines during pregnancy? What are the potential side effects of vaccines during or after pregnancy? When should I get vaccines during pregnancy? Contact a health care provider if you: Believe you have had a reaction to a vaccine. Have concerns or questions about a vaccine. Become pregnant within 1 month after you have received a live vaccine. Summary Vaccines are the most effective way to prevent certain diseases. Many vaccines are safe to receive during pregnancy. Some vaccines are recommended during pregnancy to protect you and your baby from getting sick. If you are pregnant or planning to become pregnant, talk with your health care provider about what vaccines are right for you. This information is not intended to replace advice given to you by your health care provider. Make sure you discuss any questions you have with your health care provider. Document Revised: 06/18/2019 Document Reviewed: 02/13/2018 Elsevier Patient Education  2022 [place] of Pregnancy The first trimester of pregnancy starts on the first day of your last menstrual period until the end of week 12. This is months 1 through 3 of pregnancy. A week  after a sperm fertilizes an egg, the egg will implant into the wall of the uterus and begin to develop into a baby. By the end of 12 weeks, all the baby's organs will be formed and the baby will be 2-3 inches in size. Body  changes during your first trimester Your body goes through many changes during pregnancy. The changes vary and generally return to normal after your baby is born. Physical changes You may gain or lose weight. Your breasts may begin to grow larger and become tender. The tissue that surrounds your nipples (areola) may become darker. Dark spots or blotches (chloasma or mask of pregnancy) may develop on your face. You may have changes in your hair. These can include thickening or thinning of your hair or changes in texture. Health changes You may feel nauseous, and you may vomit. You may have heartburn. You may develop headaches. You may develop constipation. Your gums may bleed and may be sensitive to brushing and flossing. Other changes You may tire easily. You may urinate more often. Your menstrual periods will stop. You may have a loss of appetite. You may develop cravings for certain kinds of food. You may have changes in your emotions from day to day. You may have more vivid and strange dreams. Follow these instructions at home: Medicines Follow your health care provider's instructions regarding medicine use. Specific medicines may be either safe or unsafe to take during pregnancy. Do not take any medicines unless told to by your health care provider. Take a prenatal vitamin that contains at least 600 micrograms (mcg) of folic acid. Eating and drinking Eat a healthy diet that includes fresh fruits and vegetables, whole grains, good sources of protein such as meat, eggs, or tofu, and low-fat dairy products. Avoid raw meat and unpasteurized juice, milk, and cheese. These carry germs that can harm you and your baby. If you feel nauseous or you vomit: Eat 4 or 5 small meals a day instead of 3 large meals. Try eating a few soda crackers. Drink liquids between meals instead of during meals. You may need to take these actions to prevent or treat constipation: Drink enough fluid to keep  your urine pale yellow. Eat foods that are high in fiber, such as beans, whole grains, and fresh fruits and vegetables. Limit foods that are high in fat and processed sugars, such as fried or sweet foods. Activity Exercise only as directed by your health care provider. Most people can continue their usual exercise routine during pregnancy. Try to exercise for 30 minutes at least 5 days a week. Stop exercising if you develop pain or cramping in the lower abdomen or lower back. Avoid exercising if it is very hot or humid or if you are at high altitude. Avoid heavy lifting. If you choose to, you may have sex unless your health care provider tells you not to. Relieving pain and discomfort Wear a good support bra to relieve breast tenderness. Rest with your legs elevated if you have leg cramps or low back pain. If you develop bulging veins (varicose veins) in your legs: Wear support hose as told by your health care provider. Elevate your feet for 15 minutes, 3-4 times a day. Limit salt in your diet. Safety Wear your seat belt at all times when driving or riding in a car. Talk with your health care provider if someone is verbally or physically abusive to you. Talk with your health care provider if you are feeling sad or have thoughts  of hurting yourself. Lifestyle Do not use hot tubs, steam rooms, or saunas. Do not douche. Do not use tampons or scented sanitary pads. Do not use herbal remedies, alcohol, illegal drugs, or medicines that are not approved by your health care provider. Chemicals in these products can harm your baby. Do not use any products that contain nicotine or tobacco, such as cigarettes, e-cigarettes, and chewing tobacco. If you need help quitting, ask your health care provider. Avoid cat litter boxes and soil used by cats. These carry germs that can cause birth defects in the baby and possibly loss of the unborn baby (fetus) by miscarriage or stillbirth. General  instructions During routine prenatal visits in the first trimester, your health care provider will do a physical exam, perform necessary tests, and ask you how things are going. Keep all follow-up visits. This is important. Ask for help if you have counseling or nutritional needs during pregnancy. Your health care provider can offer advice or refer you to specialists for help with various needs. Schedule a dentist appointment. At home, brush your teeth with a soft toothbrush. Floss gently. Write down your questions. Take them to your prenatal visits. Where to find more information American Pregnancy Association: americanpregnancy.[place] and Gynecologists: PoolDevices.com.pt Office on [employer]: KeywordPortfolios.com.br Contact a health care provider if you have: Dizziness. A fever. Mild pelvic cramps, pelvic pressure, or nagging pain in the abdominal area. Nausea, vomiting, or diarrhea that lasts for 24 hours or longer. A bad-smelling vaginal discharge. Pain when you urinate. Known exposure to a contagious illness, such as chickenpox, measles, Zika virus, HIV, or hepatitis. Get help right away if you have: Spotting or bleeding from your vagina. Severe abdominal cramping or pain. Shortness of breath or chest pain. Any kind of trauma, such as from a fall or a car crash. New or increased pain, swelling, or redness in an arm or leg. Summary The first trimester of pregnancy starts on the first day of your last menstrual period until the end of week 12 (months 1 through 3). Eating 4 or 5 small meals a day rather than 3 large meals may help to relieve nausea and vomiting. Do not use any products that contain nicotine or tobacco, such as cigarettes, e-cigarettes, and chewing tobacco. If you need help quitting, ask your health care provider. Keep all follow-up visits. This is important. This information is not intended to replace advice  given to you by your health care provider. Make sure you discuss any questions you have with your health care provider. Document Revised: 06/18/2019 Document Reviewed: 04/24/2019 Elsevier Patient Education  2022 [employer].

## 2020-09-23 NOTE — Progress Notes (Deleted)
    Routine Prenatal Care Visit  Subjective  Cathy Ortiz is a 30 y.o. G4P2002 at [redacted]w[redacted]d being seen today for ongoing prenatal care.  She is currently monitored for the following issues for this {Blank single:19197::"high-risk","low-risk"} pregnancy and has Breast nodule; Depressive disorder; Heart murmur on physical examination; Herpes simplex; Left shoulder pain; Migraines; PMDD (premenstrual dysphoric disorder); Prediabetes; and Supervision of low-risk pregnancy, first trimester on their problem list.  ----------------------------------------------------------------------------------- Patient reports {sx:14538}.    .  .   . Denies leaking of fluid.  ----------------------------------------------------------------------------------- The following portions of the patient's history were reviewed and updated as appropriate: allergies, current medications, past family history, past medical history, past social history, past surgical history and problem list. Problem list updated.   Objective  Blood pressure 116/70, height 5\' 1"  (1.549 m), weight 158 lb 3.2 oz (71.8 kg), last menstrual period 07/28/2020. Pregravid weight No episode found Total Weight Gain Not found. Urinalysis:      Fetal Status:           General:  Alert, oriented and cooperative. Patient is in no acute distress.  Skin: Skin is warm and dry. No rash noted.   Cardiovascular: Normal heart rate noted  Respiratory: Normal respiratory effort, no problems with respiration noted  Abdomen: Soft, gravid, appropriate for gestational age.       Pelvic:  {Blank single:19197::"Cervical exam performed","Cervical exam deferred"}        Extremities: Normal range of motion.     Mental Status: Normal mood and affect. Normal behavior. Normal judgment and thought content.     Assessment   29 y.o. 09/28/2020 at [redacted]w[redacted]d by  Not found. presenting for {Blank single:19197::"routine","work-in"} prenatal visit  Plan   Problem List     No episode  was linked to this visit.        Gestational age appropriate obstetric precautions including but not limited to vaginal bleeding, contractions, leaking of fluid and fetal movement were reviewed in detail with the patient.    No follow-ups on file.  [redacted]w[redacted]d MD [facility], [facility] 09/23/2020, 11:11 AM

## 2020-09-24 LAB — RPR+RH+ABO+RUB AB+AB SCR+CB...
Antibody Screen: NEGATIVE
HIV Screen 4th Generation wRfx: NONREACTIVE
Hematocrit: 33.2 % — ABNORMAL LOW (ref 34.0–46.6)
Hemoglobin: 11.2 g/dL (ref 11.1–15.9)
Hepatitis B Surface Ag: NEGATIVE
MCH: 28.5 pg (ref 26.6–33.0)
MCHC: 33.7 g/dL (ref 31.5–35.7)
MCV: 85 fL (ref 79–97)
Platelets: 287 10*3/uL (ref 150–450)
RBC: 3.93 x10E6/uL (ref 3.77–5.28)
RDW: 12.8 % (ref 11.7–15.4)
RPR Ser Ql: NONREACTIVE
Rh Factor: POSITIVE
Rubella Antibodies, IGG: 4.82 index (ref >0.99)
Varicella zoster IgG: 792 index (ref >165)
WBC: 9.3 10*3/uL (ref 3.4–10.8)

## 2020-09-24 LAB — COMPREHENSIVE METABOLIC PANEL
ALT: 17 IU/L (ref 0–32)
AST: 14 IU/L (ref 0–40)
Albumin/Globulin Ratio: 1.6 (ref 1.2–2.2)
Albumin: 4.4 g/dL (ref 3.8–4.8)
Alkaline Phosphatase: 58 IU/L (ref 44–121)
BUN/Creatinine Ratio: 14 (ref 9–23)
BUN: 10 mg/dL (ref 6–20)
Bilirubin Total: <0.2 mg/dL (ref 0.0–1.2)
CO2: 21 mmol/L (ref 20–29)
Calcium: 9.3 mg/dL (ref 8.7–10.2)
Chloride: 104 mmol/L (ref 96–106)
Creatinine, Ser: 0.73 mg/dL (ref 0.57–1.00)
Globulin, Total: 2.8 g/dL (ref 1.5–4.5)
Glucose: 82 mg/dL (ref 65–99)
Potassium: 4.1 mmol/L (ref 3.5–5.2)
Sodium: 138 mmol/L (ref 134–144)
Total Protein: 7.2 g/dL (ref 6.0–8.5)
eGFR: 113 mL/min/{1.73_m2} (ref >59)

## 2020-09-24 LAB — PROTEIN / CREATININE RATIO, URINE
Creatinine, Urine: 160.4 mg/dL
Protein, Ur: 8.6 mg/dL
Protein/Creat Ratio: 54 mg/g creat (ref 0–200)

## 2020-09-24 LAB — HEPATITIS C ANTIBODY: Hep C Virus Ab: <0.1 s/co ratio (ref 0.0–0.9)

## 2020-09-25 LAB — MONITOR DRUG PROFILE 10(MW)
Amphetamine Scrn, Ur: NEGATIVE ng/mL
BARBITURATE SCREEN URINE: NEGATIVE ng/mL
BENZODIAZEPINE SCREEN, URINE: NEGATIVE ng/mL
CANNABINOIDS UR QL SCN: NEGATIVE ng/mL
Cocaine (Metab) Scrn, Ur: NEGATIVE ng/mL
Creatinine(Crt), U: 163.7 mg/dL (ref 20.0–300.0)
Methadone Screen, Urine: NEGATIVE ng/mL
OXYCODONE+OXYMORPHONE UR QL SCN: NEGATIVE ng/mL
Opiate Scrn, Ur: NEGATIVE ng/mL
Ph of Urine: 6.7 (ref 4.5–8.9)
Phencyclidine Qn, Ur: NEGATIVE ng/mL
Propoxyphene Scrn, Ur: NEGATIVE ng/mL

## 2020-09-26 LAB — URINE CULTURE

## 2020-09-28 LAB — URINE CYTOLOGY ANCILLARY ONLY
Chlamydia: NEGATIVE
Comment: NEGATIVE
Comment: NEGATIVE
Comment: NORMAL
Neisseria Gonorrhea: NEGATIVE
Trichomonas: NEGATIVE

## 2020-09-29 ENCOUNTER — Encounter: Payer: Medicaid Other | Admitting: Advanced Practice Midwife

## 2020-10-05 ENCOUNTER — Encounter: Payer: Medicaid Other | Admitting: Obstetrics & Gynecology

## 2020-10-06 ENCOUNTER — Ambulatory Visit (INDEPENDENT_AMBULATORY_CARE_PROVIDER_SITE_OTHER): Payer: Medicaid Other | Admitting: Obstetrics

## 2020-10-06 ENCOUNTER — Other Ambulatory Visit: Payer: Self-pay

## 2020-10-06 VITALS — BP 120/70 | Wt 154.0 lb

## 2020-10-06 DIAGNOSIS — Z3491 Encounter for supervision of normal pregnancy, unspecified, first trimester: Secondary | ICD-10-CM

## 2020-10-06 DIAGNOSIS — Z1379 Encounter for other screening for genetic and chromosomal anomalies: Secondary | ICD-10-CM

## 2020-10-06 DIAGNOSIS — Z3A10 10 weeks gestation of pregnancy: Secondary | ICD-10-CM

## 2020-10-06 LAB — POCT URINALYSIS DIPSTICK OB
Glucose, UA: NEGATIVE
POC,PROTEIN,UA: NEGATIVE

## 2020-10-06 NOTE — Addendum Note (Signed)
Addended by: Cornelius Moras D on: 10/06/2020 11:52 AM   Modules accepted: Orders

## 2020-10-10 LAB — MATERNIT 21 PLUS CORE, BLOOD
Fetal Fraction: 7
Result (T21): NEGATIVE
Trisomy 13 (Patau syndrome): NEGATIVE
Trisomy 18 (Edwards syndrome): NEGATIVE
Trisomy 21 (Down syndrome): NEGATIVE

## 2020-10-18 ENCOUNTER — Emergency Department
Admission: EM | Admit: 2020-10-18 | Discharge: 2020-10-18 | Disposition: A | Discharge status: Home / Self Care | Payer: Medicaid Other | Attending: Emergency Medicine | Admitting: Emergency Medicine

## 2020-10-18 ENCOUNTER — Other Ambulatory Visit: Payer: Self-pay

## 2020-10-18 DIAGNOSIS — O210 Mild hyperemesis gravidarum: Secondary | ICD-10-CM | POA: Diagnosis not present

## 2020-10-18 DIAGNOSIS — R1110 Vomiting, unspecified: Secondary | ICD-10-CM

## 2020-10-18 DIAGNOSIS — O0289 Other abnormal products of conception: Secondary | ICD-10-CM | POA: Diagnosis not present

## 2020-10-18 DIAGNOSIS — R8271 Bacteriuria: Secondary | ICD-10-CM | POA: Diagnosis not present

## 2020-10-18 DIAGNOSIS — Z3A11 11 weeks gestation of pregnancy: Secondary | ICD-10-CM | POA: Diagnosis not present

## 2020-10-18 DIAGNOSIS — O219 Vomiting of pregnancy, unspecified: Secondary | ICD-10-CM | POA: Diagnosis present

## 2020-10-18 LAB — URINALYSIS, COMPLETE (UACMP) WITH MICROSCOPIC
Bilirubin Urine: NEGATIVE
Glucose, UA: NEGATIVE mg/dL
Hgb urine dipstick: NEGATIVE
Ketones, ur: 5 mg/dL — AB
Nitrite: NEGATIVE
Protein, ur: NEGATIVE mg/dL
Specific Gravity, Urine: 1.028 (ref 1.005–1.030)
pH: 6 (ref 5.0–8.0)

## 2020-10-18 LAB — COMPREHENSIVE METABOLIC PANEL
ALT: 17 U/L (ref 0–44)
AST: 18 U/L (ref 15–41)
Albumin: 3.6 g/dL (ref 3.5–5.0)
Alkaline Phosphatase: 42 U/L (ref 38–126)
Anion gap: 7 (ref 5–15)
BUN: 8 mg/dL (ref 6–20)
CO2: 22 mmol/L (ref 22–32)
Calcium: 8.9 mg/dL (ref 8.9–10.3)
Chloride: 106 mmol/L (ref 98–111)
Creatinine, Ser: 0.63 mg/dL (ref 0.44–1.00)
GFR, Estimated: >60 mL/min (ref >60)
Glucose, Bld: 94 mg/dL (ref 70–99)
Potassium: 3.9 mmol/L (ref 3.5–5.1)
Sodium: 135 mmol/L (ref 135–145)
Total Bilirubin: 0.4 mg/dL (ref 0.3–1.2)
Total Protein: 7.1 g/dL (ref 6.5–8.1)

## 2020-10-18 LAB — CBC
HCT: 33.2 % — ABNORMAL LOW (ref 36.0–46.0)
Hemoglobin: 11.3 g/dL — ABNORMAL LOW (ref 12.0–15.0)
MCH: 28.7 pg (ref 26.0–34.0)
MCHC: 34 g/dL (ref 30.0–36.0)
MCV: 84.3 fL (ref 80.0–100.0)
Platelets: 288 10*3/uL (ref 150–400)
RBC: 3.94 MIL/uL (ref 3.87–5.11)
RDW: 13.3 % (ref 11.5–15.5)
WBC: 10 10*3/uL (ref 4.0–10.5)
nRBC: 0 % (ref 0.0–0.2)

## 2020-10-18 LAB — LIPASE, BLOOD: Lipase: 36 U/L (ref 11–51)

## 2020-10-18 LAB — POC URINE PREG, ED: Preg Test, Ur: POSITIVE — AB

## 2020-10-18 LAB — HCG, QUANTITATIVE, PREGNANCY: hCG, Beta Chain, Quant, S: 125205 m[IU]/mL — ABNORMAL HIGH (ref <5)

## 2020-10-18 MED ORDER — CEPHALEXIN 500 MG PO CAPS: 500.0000 mg | ORAL_CAPSULE | Freq: Four times a day (QID) | ORAL | 0 refills | Status: AC

## 2020-10-18 MED ORDER — DOXYLAMINE-PYRIDOXINE 10-10 MG PO TBEC: 1.0000 | DELAYED_RELEASE_TABLET | Freq: Two times a day (BID) | ORAL | 0 refills | Status: AC | PRN

## 2020-10-18 MED ORDER — DOXYLAMINE SUCCINATE (SLEEP) 25 MG PO TABS
25.0000 mg | ORAL_TABLET | Freq: Every evening | ORAL | Status: DC | PRN
  Filled 2020-10-18: qty 1

## 2020-10-18 MED ORDER — VITAMIN B-6 50 MG PO TABS
100.0000 mg | ORAL_TABLET | Freq: Every day | ORAL | Status: DC
  Administered 2020-10-18: 100 mg via ORAL
  Filled 2020-10-18: qty 2

## 2020-10-18 MED ORDER — DOXYLAMINE SUCCINATE (SLEEP) 25 MG PO TABS
25.0000 mg | ORAL_TABLET | ORAL | Status: DC
  Filled 2020-10-18: qty 1

## 2020-10-18 NOTE — ED Provider Notes (Signed)
[facility] Emergency Department Provider Note  ____________________________________________   Event Date/Time   First MD Initiated Contact with Patient 10/18/20 1103     (approximate)  I have reviewed the triage vital signs and the nursing notes.   HISTORY  Chief Complaint Morning Sickness   HPI Cathy Ortiz is a 30 y.o. female  G4P2002 at approximately 11 weeks 5 days by last LMP who presents for assessment of some nausea and vomiting that she states has been going on throughout pregnancy.  She states she feels nauseous most days and will vomit 2 or 3 times a week.  She states she did discuss this with her OB couple weeks ago but she did not receive any medications for it at this time.  She is feeling little nauseous today but has not vomited today.  She denies any headache, earache, sore throat, cough, fevers, back pain or abdominal pain or burning with urination.  She states she had some bleeding when she was diagnosed with subchorionic hemorrhage for 5 weeks ago but has not had any since.  No other recent trauma or injuries.  No rashes.  No other acute concerns at this time.  No diarrhea or constipation.         Past Medical History:  Diagnosis Date   BV (bacterial vaginosis)    Recurrent   History of chlamydia    Seasonal allergies     Patient Active Problem List   Diagnosis Date Noted   Supervision of low-risk pregnancy, first trimester 11/14/2019   PMDD (premenstrual dysphoric disorder) 10/24/2018   Left shoulder pain 03/06/2018   Depressive disorder 08/08/2017   Prediabetes 08/08/2017   Herpes simplex 05/28/2017   Breast nodule 01/20/2014   Heart murmur on physical examination 01/20/2014   Migraines 01/20/2014    Past Surgical History:  Procedure Laterality Date   WISDOM TOOTH EXTRACTION  2016/2017   ALL FOUR    Prior to Admission medications   Medication Sig Start Date End Date Taking? Authorizing Provider  cephALEXin (KEFLEX)  500 MG capsule Take 1 capsule (500 mg total) by mouth 4 (four) times daily for 7 days. 10/18/20 10/25/20 Yes Gilles Chiquito, MD  Doxylamine-Pyridoxine (DICLEGIS) 10-10 MG TBEC Take 1 tablet by mouth 2 (two) times daily as needed for up to 7 days. 10/18/20 10/25/20 Yes Gilles Chiquito, MD  Calcium Carb-Cholecalciferol 600-400 MG-UNIT TABS Take by mouth.    [provider]  cetirizine (ZYRTEC) 10 MG tablet Take 10 mg by mouth daily.    [provider]  cholecalciferol (VITAMIN D3) 25 MCG (1000 UNIT) tablet Take 1,000 Units by mouth daily. Patient not taking: Reported on 10/06/2020    [provider]  fluticasone (FLONASE) 50 MCG/ACT nasal spray Place into both nostrils daily. Patient not taking: No sig reported    [provider]  Multiple Vitamin (MULTI-VITAMIN) tablet Take 1 tablet by mouth daily.    [provider]  progesterone 200 MG SUPP Place 1 suppository (200 mg total) vaginally at bedtime. Patient not taking: No sig reported 09/07/20   Adelene Idler R, MD  sertraline (ZOLOFT) 100 MG tablet Take by mouth. Patient not taking: No sig reported 01/29/20 01/28/21  [provider]  valACYclovir (VALTREX) 500 MG tablet Take 500 mg by mouth daily. Patient not taking: No sig reported 01/23/20   [provider]  levonorgestrel (MIRENA) 20 MCG/24HR IUD 1 each by Intrauterine route once.  03/09/19  [provider]    Allergies  Patient has no known allergies.  Family History  Problem Relation Age of Onset   Healthy Mother    Healthy Father    Cancer Maternal Grandmother 63       LUNG   Breast cancer Paternal Grandmother 56    Social History Social History   Tobacco Use   Smoking status: Never   Smokeless tobacco: Never  Vaping Use   Vaping Use: Never used  Substance Use Topics   Alcohol use: Not Currently   Drug use: Never    Review of Systems  Review of Systems  Constitutional:  Positive for malaise/fatigue  and weight loss. Negative for chills and fever.  HENT:  Negative for sore throat.   Eyes:  Negative for pain.  Respiratory:  Negative for cough and stridor.   Cardiovascular:  Negative for chest pain.  Gastrointestinal:  Positive for nausea and vomiting.  Genitourinary:  Negative for dysuria, frequency and urgency.  Musculoskeletal:  Negative for back pain and myalgias.  Skin:  Negative for rash.  Neurological:  Negative for seizures, loss of consciousness and headaches.  Psychiatric/Behavioral:  Negative for suicidal ideas.   All other systems reviewed and are negative.    ____________________________________________   PHYSICAL EXAM:  VITAL SIGNS: ED Triage Vitals  Enc Vitals Group     BP 10/18/20 0942 116/76     Pulse Rate 10/18/20 0942 96     Resp 10/18/20 0942 18     Temp 10/18/20 0942 98.9 F (37.2 C)     Temp Source 10/18/20 0942 Oral     SpO2 10/18/20 0942 97 %     Weight 10/18/20 0943 151 lb (68.5 kg)     Height 10/18/20 0943 5\' 1"  (1.549 m)     Head Circumference --      Peak Flow --      Pain Score 10/18/20 0943 0     Pain Loc --      Pain Edu? --      Excl. in GC? --    Vitals:   10/18/20 0942  BP: 116/76  Pulse: 96  Resp: 18  Temp: 98.9 F (37.2 C)  SpO2: 97%   Physical Exam Vitals and nursing note reviewed.  Constitutional:      General: She is not in acute distress.    Appearance: She is well-developed.  HENT:     Head: Normocephalic and atraumatic.     Right Ear: External ear normal.     Left Ear: External ear normal.     Nose: Nose normal.     Mouth/Throat:     Mouth: Mucous membranes are dry.  Eyes:     Conjunctiva/sclera: Conjunctivae normal.  Cardiovascular:     Rate and Rhythm: Normal rate and regular rhythm.     Heart sounds: No murmur heard. Pulmonary:     Effort: Pulmonary effort is normal. No respiratory distress.     Breath sounds: Normal breath sounds.  Abdominal:     Palpations: Abdomen is soft.     Tenderness: There is  no abdominal tenderness. There is no right CVA tenderness or left CVA tenderness.  Musculoskeletal:     Cervical back: Neck supple.  Skin:    General: Skin is warm and dry.     Capillary Refill: Capillary refill takes less than 2 seconds.  Neurological:     Mental Status: She is alert and oriented to person, place, and time.  Psychiatric:        Mood and Affect:  Mood normal.    Abdomen is gravid but nontender throughout. ____________________________________________   LABS (all labs ordered are listed, but only abnormal results are displayed)  Labs Reviewed  CBC - Abnormal; Notable for the following components:      Result Value   Hemoglobin 11.3 (*)    HCT 33.2 (*)    All other components within normal limits  URINALYSIS, COMPLETE (UACMP) WITH MICROSCOPIC - Abnormal; Notable for the following components:   Color, Urine YELLOW (*)    APPearance HAZY (*)    Ketones, ur 5 (*)    Leukocytes,Ua MODERATE (*)    Bacteria, UA RARE (*)    All other components within normal limits  HCG, QUANTITATIVE, PREGNANCY - Abnormal; Notable for the following components:   hCG, Beta Chain, Quant, S 125,205 (*)    All other components within normal limits  POC URINE PREG, ED - Abnormal; Notable for the following components:   Preg Test, Ur Positive (*)    All other components within normal limits  URINE CULTURE  LIPASE, BLOOD  COMPREHENSIVE METABOLIC PANEL   ____________________________________________  EKG  ____________________________________________  RADIOLOGY  ED MD interpretation:  Official radiology report(s): No results found.  ____________________________________________   PROCEDURES  Procedure(s) performed (including Critical Care):  Procedures   ____________________________________________   INITIAL IMPRESSION / ASSESSMENT AND PLAN / ED COURSE      Patient presents with above-stated history exam with concerns that she is having continued nausea and couple days  of vomiting each week of course of her pregnancy has not been seeming to get better.  She also is worried she may be losing some weight.  On arrival she is afebrile hemodynamically stable.  She is not currently on any antiemetics.  She does look very slightly dehydrated but otherwise does not appear toxic or septic.  Her abdomen is soft nontender throughout and she has no CVA tenderness.  Her lungs are clear bilaterally.  CMP shows no significant electrolyte or metabolic derangements.  No evidence of hepatitis or cholestasis and I have low suspicion for acute cholecystitis at this time.  CBC shows no leukocytosis and hemoglobin 11.3.  hCG is appropriate at 125,000 205.  UA has some bacteria and moderate leukocyte esterase and will patient denies any symptoms related to this and I do not believe she has clinically significant cystitis or pyelonephritis I will treat this as asymptomatic bacteriuria in pregnancy at this time with a course of Keflex.  Suspect likely hyperemesis related to pregnancy and given chronicity of the symptoms otherwise stable vitals and reassuring exam I think she is stable for discharge as after receiving Diclegis she is able to tolerate p.o. and states she is feeling better.  In addition to a course of Keflex I will write for prescription for Diclegis.  She will follow-up with her OB.  Discharged stable condition.  Strict return precautions advised and discussed.        ____________________________________________   FINAL CLINICAL IMPRESSION(S) / ED DIAGNOSES  Final diagnoses:  Hyperemesis  Asymptomatic bacteriuria    Medications  pyridOXINE (VITAMIN B-6) tablet 100 mg (100 mg Oral Given 10/18/20 1153)  doxylamine (Sleep) (UNISOM) tablet 25 mg (has no administration in time range)     ED Discharge Orders          Ordered    cephALEXin (KEFLEX) 500 MG capsule  4 times daily        10/18/20 1223    Doxylamine-Pyridoxine (DICLEGIS) 10-10 MG TBEC  2 times  daily PRN         10/18/20 1223             Note:  This document was prepared using Dragon voice recognition software and may include unintentional dictation errors.    Gilles Chiquito, MD 10/18/20 1228

## 2020-10-18 NOTE — ED Triage Notes (Signed)
Pt reports that she is 11 weeks 5 days pregnant and has been nauseated for most of that, she has had some emesis but not much. She is just constantly nauseated.

## 2020-10-19 LAB — URINE CULTURE: Culture: 80000 — AB

## 2020-10-20 ENCOUNTER — Encounter: Payer: Medicaid Other | Admitting: Obstetrics and Gynecology

## 2020-10-21 ENCOUNTER — Encounter: Payer: Medicaid Other | Admitting: Obstetrics & Gynecology

## 2020-10-27 ENCOUNTER — Encounter: Payer: Self-pay | Admitting: Obstetrics & Gynecology

## 2020-10-27 ENCOUNTER — Other Ambulatory Visit: Payer: Self-pay

## 2020-10-27 ENCOUNTER — Ambulatory Visit (INDEPENDENT_AMBULATORY_CARE_PROVIDER_SITE_OTHER): Payer: Medicaid Other | Admitting: Obstetrics & Gynecology

## 2020-10-27 VITALS — BP 118/78 | Wt 151.0 lb

## 2020-10-27 DIAGNOSIS — Z3A13 13 weeks gestation of pregnancy: Secondary | ICD-10-CM

## 2020-10-27 DIAGNOSIS — Z3689 Encounter for other specified antenatal screening: Secondary | ICD-10-CM

## 2020-10-27 DIAGNOSIS — Z3491 Encounter for supervision of normal pregnancy, unspecified, first trimester: Secondary | ICD-10-CM

## 2020-10-27 MED ORDER — VITAMIN D 25 MCG (1000 UNIT) PO TABS: 1000.0000 [IU] | ORAL_TABLET | Freq: Every day | ORAL | 11 refills | Status: AC

## 2020-10-27 MED ORDER — ONDANSETRON 4 MG PO TBDP: 4.0000 mg | ORAL_TABLET | Freq: Four times a day (QID) | ORAL | 1 refills | Status: DC | PRN

## 2020-10-27 NOTE — Patient Instructions (Signed)
Hyperemesis Gravidarum Hyperemesis gravidarum is a severe form of nausea and vomiting that happens during pregnancy. Hyperemesis is worse than morning sickness. It may cause you to have nausea or vomiting all day for many days. It may keep you from eating and drinking enough food and liquids, which can lead to dehydration, malnutrition, and weight loss. Hyperemesis usually occurs during the first half (the first 20 weeks) of pregnancy. It often goes away once a woman is in her second half of pregnancy. However, sometimes hyperemesis continues through anentire pregnancy. What are the causes? The cause of this condition is not known. It may be associated with: Changes in hormones in the body during pregnancy. Changes in the gastrointestinal system. Genetic or inherited conditions. What are the signs or symptoms? Symptoms of this condition include: Severe nausea and vomiting that does not go away. Problems keeping food down. Weight loss. Loss of body fluid (dehydration). Loss of appetite. You may have no desire to eat or you may not like the food you have previously enjoyed. How is this diagnosed? This condition may be diagnosed based on your medical history, your symptoms,and a physical exam. You may also have other tests, including: Blood tests. Urine tests. Blood pressure tests. Ultrasound to look for problems with the placenta or to check if you are pregnant with more than one baby. How is this treated? This condition is managed by controlling symptoms. This may include: Following an eating plan. This can help to lessen nausea and vomiting. Treatments that do not use medicine. These include acupressure bracelets, hypnosis, and eating or drinking foods or fluids that contain ginger, ginger ale, or ginger tea. Taking prescription medicine or over-the-counter medicine as told by your health care provider. Continuing to take prenatal vitamins. You may need to change what kind you take and when  you take them. Follow your health care provider's instructions about prenatal vitamins. An eating plan and medicines are often used together to help control symptoms. If medicines do not help relieve nausea and vomiting, you may need to receivefluids through an IV at the hospital. Follow these instructions at home: To help relieve your symptoms, listen to your body. Everyone is different and has different preferences. Find what works best for you. Here are some thingsyou can try to help relieve your symptoms: Meals and snacks  Eat 5-6 small meals daily instead of 3 large meals. Eating small meals and snacks can help you avoid an empty stomach. Before getting out of bed, eat a couple of crackers to avoid moving around on an empty stomach. Eat a protein-rich snack before bed. Examples include cheese and crackers, or a peanut butter sandwich made with 1 slice of whole-wheat bread and 1 tsp (5 g) of peanut butter. Eat and drink slowly. Try eating starchy foods as these are usually tolerated well. Examples include cereal, toast, bread, potatoes, pasta, rice, and pretzels. Eat at least one serving of protein with your meals and snacks. Protein options include lean meats, poultry, seafood, beans, nuts, nut butters, eggs, cheese, and yogurt. Eat or suck on things that have ginger in them. It may help to relieve nausea. Add  tsp (0.44 g) ground ginger to hot tea, or choose ginger tea.  Fluids It is important to stay hydrated. Try to: Drink small amounts of fluids often. Drink fluids 30 minutes before or after a meal to help lessen the feeling of a full stomach. Drink 100% fruit juice or an electrolyte drink. An electrolyte drink contains sodium, potassium, and   chloride. Drink fluids that are cold, clear, and carbonated or sour. These include lemonade, ginger ale, lemon-lime soda, ice water, and sparkling water. Things to avoid Avoid the following: Eating foods that trigger your symptoms. These may  include spicy foods, coffee, high-fat foods, very sweet foods, and acidic foods. Drinking more than 1 cup of fluid at a time. Skipping meals. Nausea can be more intense on an empty stomach. If you cannot tolerate food, do not force it. Try sucking on ice chips or other frozen items and make up for missed calories later. Lying down within 2 hours after eating. Being exposed to environmental triggers. These may include food smells, smoky rooms, closed spaces, rooms with strong smells, warm or humid places, overly loud and noisy rooms, and rooms with motion or flickering lights. Try eating meals in a well-ventilated area that is free of strong smells. Making quick and sudden changes in your movement. Taking iron pills and multivitamins that contain iron. If you take prescription iron pills, do not stop taking them unless your health care provider approves. Preparing food. The smell of food can spoil your appetite or trigger nausea. General instructions Brush your teeth or use a mouth rinse after meals. Take over-the-counter and prescription medicines only as told by your health care provider. Follow instructions from your health care provider about eating or drinking restrictions. Talk with your health care provider about starting a supplement of vitamin B6. Continue to take your prenatal vitamins as told by your health care provider. If you are having trouble taking your prenatal vitamins, talk with your health care provider about other options. Keep all follow-up visits. This is important. Follow-up visits include prenatal visits. Contact a health care provider if: You have pain in your abdomen. You have a severe headache. You have vision problems. You are losing weight. You feel weak or dizzy. You cannot eat or drink without vomiting, especially if this goes on for a full day. Get help right away if: You cannot drink fluids without vomiting. You vomit blood. You have constant nausea and  vomiting. You are very weak. You faint. You have a fever and your symptoms suddenly get worse. Summary Hyperemesis gravidarum is a severe form of nausea and vomiting that happens during pregnancy. Making some changes to your eating habits may help relieve nausea and vomiting. This condition may be managed with lifestyle changes and medicines as prescribed by your health care provider. If medicines do not help relieve nausea and vomiting, you may need to receive fluids through an IV at the hospital. This information is not intended to replace advice given to you by your health care provider. Make sure you discuss any questions you have with your healthcare provider. Document Revised: 08/04/2019 Document Reviewed: 08/04/2019 Elsevier Patient Education  2022 [employer]  

## 2020-10-27 NOTE — Progress Notes (Signed)
  Subjective  Pt has had some dizziness since last week. Says she feels like her iron may be a little low, she IS taking her PNV. Wondering if she needs an extra iron supplement.   Dizziness in am.  Diclegis did not help, may have aggravated sx's.  Objective  BP 118/78   Wt 151 lb (68.5 kg)   LMP 07/28/2020 (Exact Date)   BMI 28.53 kg/m  General: NAD Pumonary: no increased work of breathing Abdomen: gravid, non-tender Extremities: no edema Psychiatric: mood appropriate, affect full  Assessment  30 y.o. G3P2002 at [redacted]w[redacted]d by  05/04/2021, Date entered prior to episode creation presenting for routine prenatal visit  Plan   Problem List Items Addressed This Visit       Other   Supervision of low-risk pregnancy, first trimester - Primary   Other Visit Diagnoses     [redacted] weeks gestation of pregnancy       Screening, antenatal, for fetal anatomic survey       Relevant Orders   US OB Comp + 14 Wk     Plan Zofran, hydration, continued monitoring Can work up dizziness further if persists but likely pregnancy related PNV  PREGANCY5 Problems (from 09/23/20 to present)     Problem Noted Resolved   Supervision of low-risk pregnancy, first trimester 11/14/2019 by Nadara Mustard, MD No   Overview Addendum 10/11/2020  2:22 PM by Mirna Mires, CNM     Nursing Staff Provider  Office Location  [facility] Dating   LMP = 7 wk [place]  Language  English Anatomy US    Flu Vaccine   Genetic Screen  NIPS: MaternT test negative- xy  TDaP vaccine    Hgb A1C or  GTT Early : Third trimester :   Covid    LAB RESULTS   Rhogam   Blood Type B/Positive/-- (09/01 1129)   Feeding Plan  Antibody Negative (09/01 1129)  Contraception  Rubella 4.82 (09/01 1129)  Circumcision  RPR Non Reactive (09/01 1129)   Pediatrician   HBsAg Negative (09/01 1129)   Support Person  HIV Non Reactive (09/01 1129)  Prenatal Classes  Varicella Immune    GBS  (For PCN allergy, check sensitivities)   BTL Consent    Hep C  negative  VBAC Consent  Pap  2021 NIL HPV +     Hgb Electro      CF      SMA                    Annamarie Major, MD, [facility], [facility] 10/27/2020  9:16 AM

## 2020-11-03 ENCOUNTER — Other Ambulatory Visit: Payer: Self-pay

## 2020-11-03 ENCOUNTER — Encounter: Payer: Self-pay | Admitting: Advanced Practice Midwife

## 2020-11-03 ENCOUNTER — Ambulatory Visit (INDEPENDENT_AMBULATORY_CARE_PROVIDER_SITE_OTHER): Payer: Medicaid Other | Admitting: Advanced Practice Midwife

## 2020-11-03 VITALS — BP 120/80 | Wt 161.0 lb

## 2020-11-03 DIAGNOSIS — Z3A14 14 weeks gestation of pregnancy: Secondary | ICD-10-CM

## 2020-11-03 DIAGNOSIS — Z3482 Encounter for supervision of other normal pregnancy, second trimester: Secondary | ICD-10-CM

## 2020-11-03 DIAGNOSIS — Z369 Encounter for antenatal screening, unspecified: Secondary | ICD-10-CM

## 2020-11-03 LAB — POCT URINALYSIS DIPSTICK OB
Glucose, UA: NEGATIVE
POC,PROTEIN,UA: NEGATIVE

## 2020-11-03 NOTE — Patient Instructions (Signed)

## 2020-11-03 NOTE — Progress Notes (Signed)
  Routine Prenatal Care Visit  Subjective  Cathy Ortiz is a 30 y.o. G3P2002 at [redacted]w[redacted]d being seen today for ongoing prenatal care.  She is currently monitored for the following issues for this low-risk pregnancy and has Breast nodule; Depressive disorder; Heart murmur on physical examination; Herpes simplex; Left shoulder pain; Migraines; PMDD (premenstrual dysphoric disorder); Prediabetes; and Supervision of normal pregnancy on their problem list.  ----------------------------------------------------------------------------------- Patient reports no complaints.  Dizziness that occurred a couple weeks ago resolved after 2 days Contractions: Not present. Vag. Bleeding: None.  Movement: Absent. Leaking Fluid denies.  ----------------------------------------------------------------------------------- The following portions of the patient's history were reviewed and updated as appropriate: allergies, current medications, past family history, past medical history, past social history, past surgical history and problem list. Problem list updated.  Objective  Blood pressure 120/80, weight 161 lb (73 kg), last menstrual period 07/28/2020. Pregravid weight 158 lb (71.7 kg) Total Weight Gain 3 lb (1.361 kg) Urinalysis: Urine Protein    Urine Glucose    Fetal Status: Fetal Heart Rate (bpm): 163   Movement: Absent     General:  Alert, oriented and cooperative. Patient is in no acute distress.  Skin: Skin is warm and dry. No rash noted.   Cardiovascular: Normal heart rate noted  Respiratory: Normal respiratory effort, no problems with respiration noted  Abdomen: Soft, gravid, appropriate for gestational age. Pain/Pressure: Absent     Pelvic:  Cervical exam deferred        Extremities: Normal range of motion.  Edema: None  Mental Status: Normal mood and affect. Normal behavior. Normal judgment and thought content.   Assessment   30 y.o. [id number] at [redacted]w[redacted]d by  05/04/2021, Date entered prior to episode  creation presenting for routine prenatal visit  Plan   PREGANCY5 Problems (from 09/23/20 to present)    Problem Noted Resolved   Supervision of normal pregnancy 11/14/2019 by Nadara Mustard, MD No   Overview Addendum 10/27/2020  9:18 AM by Nadara Mustard, MD     Nursing Staff Provider  Office Location  [facility] Dating   LMP = 7 wk [place]  Language  English Anatomy US    Flu Vaccine   Genetic Screen  NIPS: MaternT test negative- xy  TDaP vaccine    Hgb A1C or  GTT  Third trimester :   Covid    LAB RESULTS   Rhogam   Blood Type B/Positive/-- (09/01 1129)   Feeding Plan  Antibody Negative (09/01 1129)  Contraception  Rubella 4.82 (09/01 1129)  Circumcision  RPR Non Reactive (09/01 1129)   Pediatrician   HBsAg Negative (09/01 1129)   Support Person  HIV Non Reactive (09/01 1129)  Prenatal Classes  Varicella Immune    GBS  (For PCN allergy, check sensitivities)   BTL Consent    Hep C negative  VBAC Consent n/a Pap  2021 NIL HPV +     Hgb Electro      CF      SMA                   Preterm labor symptoms and general obstetric precautions including but not limited to vaginal bleeding, contractions, leaking of fluid and fetal movement were reviewed in detail with the patient. Please refer to After Visit Summary for other counseling recommendations.   Return in about 4 weeks (around 12/01/2020) for scheduled visits.  Tresea Mall, CNM 11/03/2020 9:15 AM

## 2020-11-03 NOTE — Addendum Note (Signed)
Addended by: Cornelius Moras D on: 11/03/2020 09:21 AM   Modules accepted: Orders

## 2020-11-05 NOTE — Telephone Encounter (Signed)
Pt has been seen in office since this msg.

## 2020-12-08 ENCOUNTER — Ambulatory Visit (INDEPENDENT_AMBULATORY_CARE_PROVIDER_SITE_OTHER): Payer: Medicaid Other | Admitting: Advanced Practice Midwife

## 2020-12-08 ENCOUNTER — Other Ambulatory Visit: Payer: Self-pay

## 2020-12-08 ENCOUNTER — Encounter: Payer: Self-pay | Admitting: Advanced Practice Midwife

## 2020-12-08 VITALS — BP 115/66 | Wt 163.0 lb

## 2020-12-08 DIAGNOSIS — R42 Dizziness and giddiness: Secondary | ICD-10-CM

## 2020-12-08 DIAGNOSIS — O26892 Other specified pregnancy related conditions, second trimester: Secondary | ICD-10-CM | POA: Diagnosis not present

## 2020-12-08 DIAGNOSIS — Z3482 Encounter for supervision of other normal pregnancy, second trimester: Secondary | ICD-10-CM

## 2020-12-08 DIAGNOSIS — Z3A19 19 weeks gestation of pregnancy: Secondary | ICD-10-CM

## 2020-12-08 NOTE — Progress Notes (Signed)
Routine Prenatal Care Visit  Subjective  Cathy Ortiz is a 30 y.o. G3P2002 at [redacted]w[redacted]d being seen today for ongoing prenatal care.  She is currently monitored for the following issues for this low-risk pregnancy and has Breast nodule; Depressive disorder; Heart murmur on physical examination; Herpes simplex; Left shoulder pain; Migraines; PMDD (premenstrual dysphoric disorder); Prediabetes; and Supervision of normal pregnancy on their problem list.  ----------------------------------------------------------------------------------- Patient reports dizziness for the past 3 or 4 days. She notices the feeling when she gets out of bed in the morning and it makes her feel like she is leaning. The feeling lasts all day and is accompanied by nausea. She takes zyrtec daily for allergies.   Contractions: Not present. Vag. Bleeding: None.  Movement: Present. Leaking Fluid denies.  ----------------------------------------------------------------------------------- The following portions of the patient's history were reviewed and updated as appropriate: allergies, current medications, past family history, past medical history, past social history, past surgical history and problem list. Problem list updated.  Objective  Blood pressure 115/66, weight 163 lb (73.9 kg), last menstrual period 07/28/2020. Pregravid weight 158 lb (71.7 kg) Total Weight Gain 5 lb (2.268 kg) Urinalysis: Urine Protein    Urine Glucose    Fetal Status: Fetal Heart Rate (bpm): 154   Movement: Present     General:  Alert, oriented and cooperative. Patient is in no acute distress.  Skin: Skin is warm and dry. No rash noted.   Cardiovascular: Normal heart rate noted  Respiratory: Normal respiratory effort, no problems with respiration noted  Abdomen: Soft, gravid, appropriate for gestational age. Pain/Pressure: Absent     Pelvic:  Cervical exam deferred        Extremities: Normal range of motion.  Edema: None  Mental Status: Normal  mood and affect. Normal behavior. Normal judgment and thought content.   Assessment   30 y.o. [id number] at [redacted]w[redacted]d by  05/04/2021, Date entered prior to episode creation presenting for work-in prenatal visit  Plan   PREGANCY5 Problems (from 09/23/20 to present)    Problem Noted Resolved   Supervision of normal pregnancy 11/14/2019 by Nadara Mustard, MD No   Overview Addendum 10/27/2020  9:18 AM by Nadara Mustard, MD     Nursing Staff Provider  Office Location  [facility] Dating   LMP = 7 wk [place]  Language  English Anatomy US    Flu Vaccine   Genetic Screen  NIPS: MaternT test negative- xy  TDaP vaccine    Hgb A1C or  GTT  Third trimester :   Covid    LAB RESULTS   Rhogam   Blood Type B/Positive/-- (09/01 1129)   Feeding Plan  Antibody Negative (09/01 1129)  Contraception  Rubella 4.82 (09/01 1129)  Circumcision  RPR Non Reactive (09/01 1129)   Pediatrician   HBsAg Negative (09/01 1129)   Support Person  HIV Non Reactive (09/01 1129)  Prenatal Classes  Varicella Immune    GBS  (For PCN allergy, check sensitivities)   BTL Consent    Hep C negative  VBAC Consent n/a Pap  2021 NIL HPV +     Hgb Electro      CF      SMA                   Preterm labor symptoms and general obstetric precautions including but not limited to vaginal bleeding, contractions, leaking of fluid and fetal movement were reviewed in detail with the patient. Please refer to After Visit Summary for other  counseling recommendations.   Ambulatory referral to ENT to evaluate symptoms of vertigo Return for scheduled visits.  Tresea Mall, CNM 12/08/2020 3:57 PM

## 2020-12-08 NOTE — Patient Instructions (Signed)
Vertigo Vertigo is the feeling that you or your surroundings are moving when they are not. This feeling can come and go at any time. Vertigo often goes away on its own. Vertigo can be dangerous if it occurs while you are doing something thatcould endanger yourself or others, such as driving or operating machinery. Your health care provider will do tests to try to determine the cause of your vertigo. Tests will also help your health care provider decide how best totreat your condition. Follow these instructions at home: Eating and drinking     Dehydration can make vertigo worse. Drink enough fluid to keep your urine pale yellow. Do not drink alcohol. Activity Return to your normal activities as told by your health care provider. Ask your health care provider what activities are safe for you. In the morning, first sit up on the side of the bed. When you feel okay, stand slowly while you hold onto something until you know that your balance is fine. Move slowly. Avoid sudden body or head movements or certain positions, as told by your health care provider. If you have trouble walking or keeping your balance, try using a cane for stability. If you feel dizzy or unstable, sit down right away. Avoid doing any tasks that would cause danger to you or others if vertigo occurs. Avoid bending down if you feel dizzy. Place items in your home so that they are easy for you to reach without bending or leaning over. Do not drive or use machinery if you feel dizzy. General instructions Take over-the-counter and prescription medicines only as told by your health care provider. Keep all follow-up visits. This is important. Contact a health care provider if: Your medicines do not relieve your vertigo or they make it worse. Your condition gets worse or you develop new symptoms. You have a fever. You develop nausea or vomiting, or if nausea gets worse. Your family or friends notice any behavioral changes. You  have numbness or a prickling and tingling sensation in part of your body. Get help right away if you: Are always dizzy or you faint. Develop severe headaches. Develop a stiff neck. Develop sensitivity to light. Have difficulty moving or speaking. Have weakness in your hands, arms, or legs. Have changes in your hearing or vision. These symptoms may represent a serious problem that is an emergency. Do not wait to see if the symptoms will go away. Get medical help right away. Call your local emergency services (911 in the [place] Do not drive yourself to the hospital. Summary Vertigo is the feeling that you or your surroundings are moving when they are not. Your health care provider will do tests to try to determine the cause of your vertigo. Follow instructions for home care. You may be told to avoid certain tasks, positions, or movements. Contact a health care provider if your medicines do not relieve your symptoms, or if you have a fever, nausea, vomiting, or changes in behavior. Get help right away if you have severe headaches or difficulty speaking, or you develop hearing or vision problems. This information is not intended to replace advice given to you by your health care provider. Make sure you discuss any questions you have with your healthcare provider. Document Revised: 12/10/2019 Document Reviewed: 12/10/2019 Elsevier Patient Education  2022 Elsevier Inc.  

## 2020-12-14 ENCOUNTER — Ambulatory Visit
Admission: RE | Admit: 2020-12-14 | Discharge: 2020-12-14 | Disposition: A | Discharge status: Home / Self Care | Payer: Medicaid Other | Source: Ambulatory Visit | Attending: Obstetrics & Gynecology | Admitting: Obstetrics & Gynecology

## 2020-12-14 DIAGNOSIS — Z3689 Encounter for other specified antenatal screening: Secondary | ICD-10-CM | POA: Insufficient documentation

## 2020-12-15 ENCOUNTER — Other Ambulatory Visit: Payer: Self-pay

## 2020-12-15 ENCOUNTER — Encounter: Payer: Self-pay | Admitting: Obstetrics and Gynecology

## 2020-12-15 ENCOUNTER — Ambulatory Visit (INDEPENDENT_AMBULATORY_CARE_PROVIDER_SITE_OTHER): Payer: Medicaid Other | Admitting: Obstetrics and Gynecology

## 2020-12-15 DIAGNOSIS — Z3482 Encounter for supervision of other normal pregnancy, second trimester: Secondary | ICD-10-CM | POA: Diagnosis not present

## 2020-12-15 DIAGNOSIS — Z3A20 20 weeks gestation of pregnancy: Secondary | ICD-10-CM | POA: Diagnosis not present

## 2020-12-15 DIAGNOSIS — Z362 Encounter for other antenatal screening follow-up: Secondary | ICD-10-CM

## 2020-12-15 MED ORDER — AZITHROMYCIN 250 MG PO TABS: ORAL_TABLET | ORAL | 0 refills | Status: AC

## 2020-12-15 NOTE — Progress Notes (Signed)
I connected with Cathy Ortiz  12/15/20 at  8:50 AM EST by telephone and verified that I am speaking with the correct person using two identifiers.   I discussed the limitations, risks, security and privacy concerns of performing an evaluation and management service by telephone and the availability of in person appointments. I also discussed with the patient that there may be a patient responsible charge related to this service. The patient expressed understanding and agreed to proceed.  The patient was at home I spoke with the patient from my workstation phone The names of people involved in this encounter were: Cathy Ortiz , and Cathy Ortiz  Routine Prenatal Care Visit  Subjective  [name] is a 30 y.o. G3P2002 at [redacted]w[redacted]d being seen today for ongoing prenatal care.  She is currently monitored for the following issues for this low-risk pregnancy and has Breast nodule; Depressive disorder; Heart murmur on physical examination; Herpes simplex; Left shoulder pain; Migraines; PMDD (premenstrual dysphoric disorder); Prediabetes; and Supervision of normal pregnancy on their problem list.  ----------------------------------------------------------------------------------- Patient reports  URI symptoms .   Contractions: Irregular. Vag. Bleeding: None.  Movement: Present. Denies leaking of fluid.  ----------------------------------------------------------------------------------- The following portions of the patient's history were reviewed and updated as appropriate: allergies, current medications, past family history, past medical history, past social history, past surgical history and problem list. Problem list updated.   Objective  Last menstrual period 07/28/2020. Pregravid weight 158 lb (71.7 kg) Total Weight Gain 5 lb (2.268 kg) Urinalysis:      Fetal Status:     Movement: Present     No physical exam as this was a remote telephone visit to promote social distancing during  the current COVID-19 Pandemic   Assessment   30 y.o. [id number] at [redacted]w[redacted]d by  05/04/2021, Date entered prior to episode creation presenting for routine prenatal visit  Plan   PREGANCY5 Problems (from 09/23/20 to present)     Problem Noted Resolved   Supervision of normal pregnancy 11/14/2019 by Nadara Mustard, MD No   Overview Addendum 10/27/2020  9:18 AM by Nadara Mustard, MD     Nursing Staff Provider  Office Location  [facility] Dating   LMP = 7 wk [place]  Language  English Anatomy US    Flu Vaccine   Genetic Screen  NIPS: MaternT test negative- xy  TDaP vaccine    Hgb A1C or  GTT  Third trimester :   Covid    LAB RESULTS   Rhogam   Blood Type B/Positive/-- (09/01 1129)   Feeding Plan  Antibody Negative (09/01 1129)  Contraception  Rubella 4.82 (09/01 1129)  Circumcision  RPR Non Reactive (09/01 1129)   Pediatrician   HBsAg Negative (09/01 1129)   Support Person  HIV Non Reactive (09/01 1129)  Prenatal Classes  Varicella Immune    GBS  (For PCN allergy, check sensitivities)   BTL Consent    Hep C negative  VBAC Consent n/a Pap  2021 NIL HPV +     Hgb Electro      CF      SMA                   Gestational age appropriate obstetric precautions including but not limited to vaginal bleeding, contractions, leaking of fluid and fetal movement were reviewed in detail with the patient.    - having URI symptoms negative for flu and covid.  Given Rx for azithromycin should symptoms worsen over the  holiday - discussed fluids and hydration during illness is having some more cramping (normal cervical length and two prior term deliveries) - safe meds in pregnany sent through my chart - anatomy scan incomplete follow up cardiac views ordered  No follow-ups on file.  Cathy Austria, MD, [facility], [facility] 12/15/2020, 8:46 AM

## 2020-12-15 NOTE — Progress Notes (Signed)
ROB -  Phone visit, no concerns.

## 2020-12-23 ENCOUNTER — Ambulatory Visit
Admission: RE | Admit: 2020-12-23 | Discharge: 2020-12-23 | Disposition: A | Discharge status: Home / Self Care | Payer: Medicaid Other | Source: Ambulatory Visit | Attending: Obstetrics and Gynecology | Admitting: Obstetrics and Gynecology

## 2020-12-23 ENCOUNTER — Other Ambulatory Visit: Payer: Self-pay

## 2020-12-23 DIAGNOSIS — Z3482 Encounter for supervision of other normal pregnancy, second trimester: Secondary | ICD-10-CM | POA: Diagnosis present

## 2020-12-23 DIAGNOSIS — Z362 Encounter for other antenatal screening follow-up: Secondary | ICD-10-CM | POA: Diagnosis not present

## 2020-12-28 ENCOUNTER — Encounter: Payer: Medicaid Other | Admitting: Advanced Practice Midwife

## 2021-01-14 DIAGNOSIS — Z8759 Personal history of other complications of pregnancy, childbirth and the puerperium: Secondary | ICD-10-CM | POA: Insufficient documentation

## 2021-01-14 DIAGNOSIS — Z309 Encounter for contraceptive management, unspecified: Secondary | ICD-10-CM | POA: Insufficient documentation

## 2021-02-09 DIAGNOSIS — D649 Anemia, unspecified: Secondary | ICD-10-CM | POA: Insufficient documentation

## 2021-03-10 DIAGNOSIS — Z8759 Personal history of other complications of pregnancy, childbirth and the puerperium: Secondary | ICD-10-CM | POA: Insufficient documentation

## 2021-04-14 IMAGING — US US OB < 14 WEEKS - US OB TV
1 series · 13 of 28 positions shown · non-contrast
Comparison: 11/25/2019

CLINICAL DATA: Abnormal beta HCG levels.

EXAM:
OBSTETRIC <14 WK US AND TRANSVAGINAL OB US
TECHNIQUE: Both transabdominal and transvaginal ultrasound examinations were
performed for complete evaluation of the gestation as well as the
maternal uterus, adnexal regions, and pelvic cul-de-sac.
Transvaginal technique was performed to assess early pregnancy.

[Series 1: us ob < 14 weeks - us ob tv · 0.20mm/px · 13 of 83 slices shown]
[im 4/83]
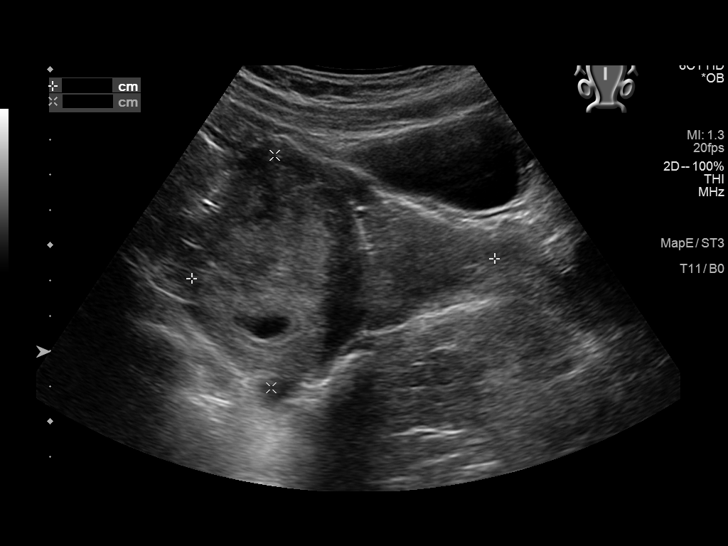
[im 10/83]
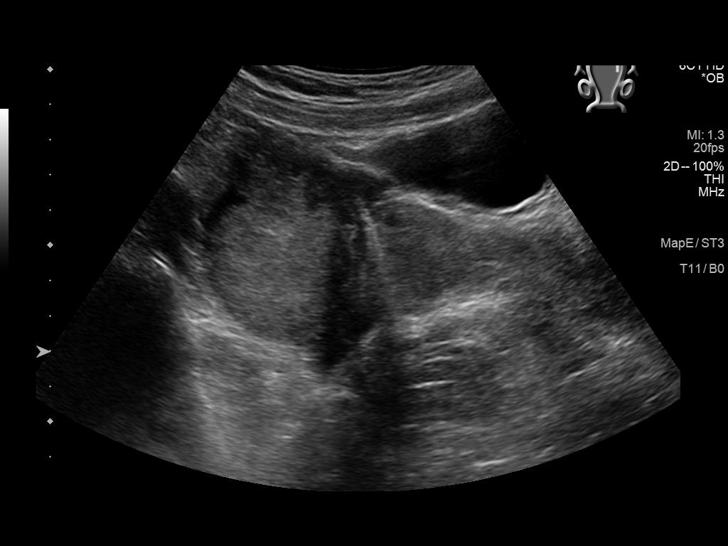
[im 16/83]
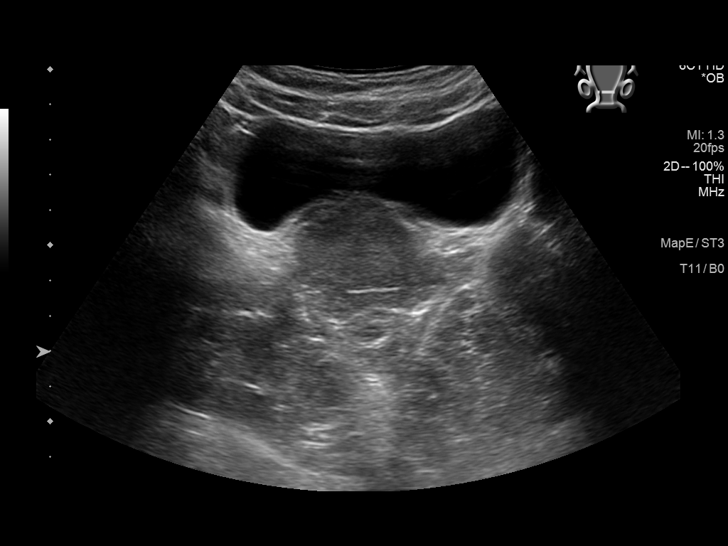
[im 22/83]
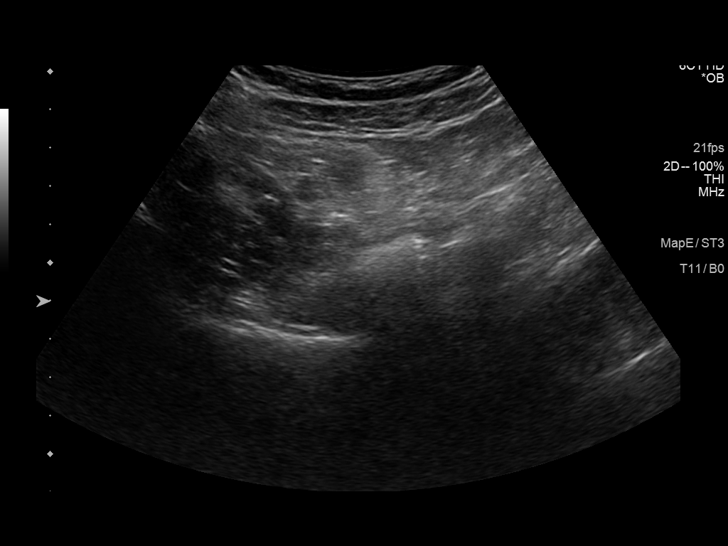
[im 28/83]
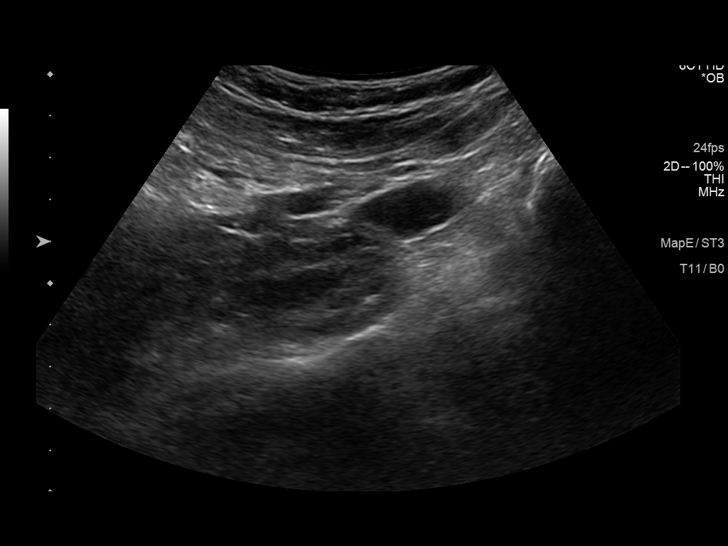
[im 34/83]
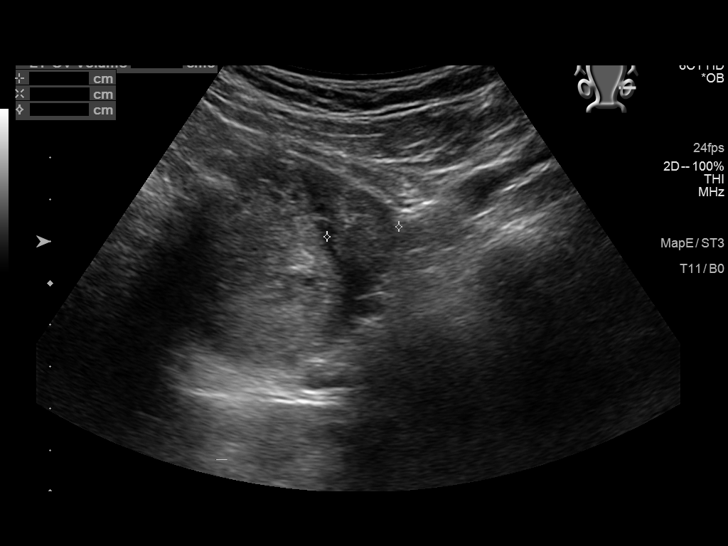
[im 43/83]
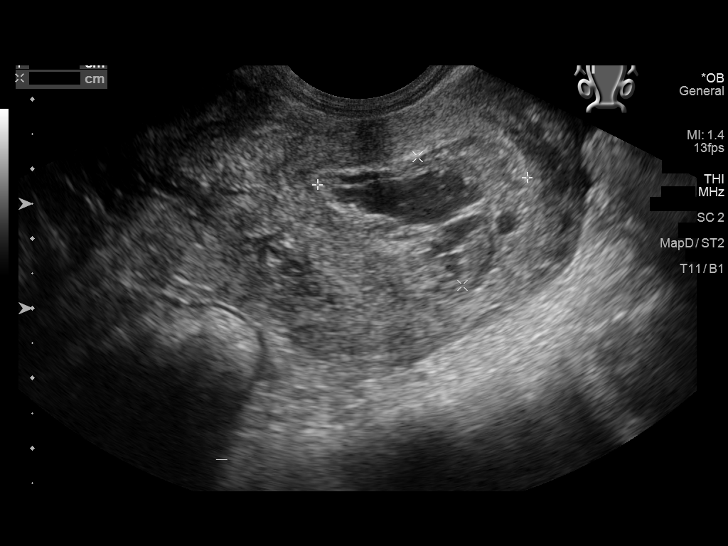
[im 49/83]
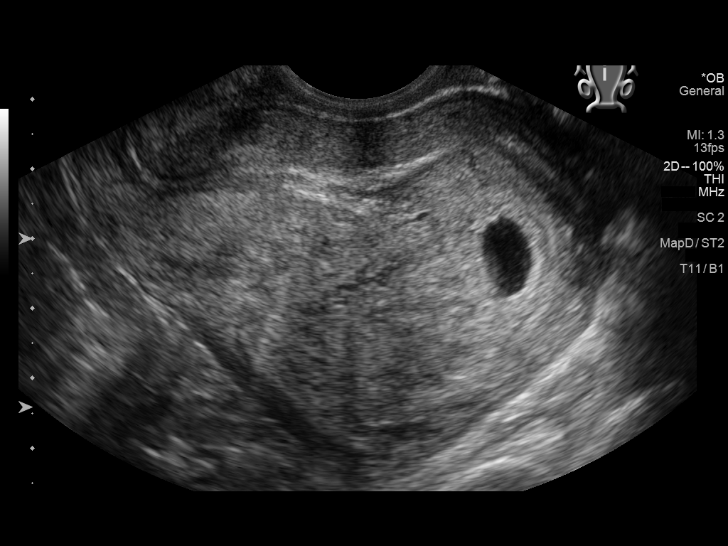
[im 55/83]
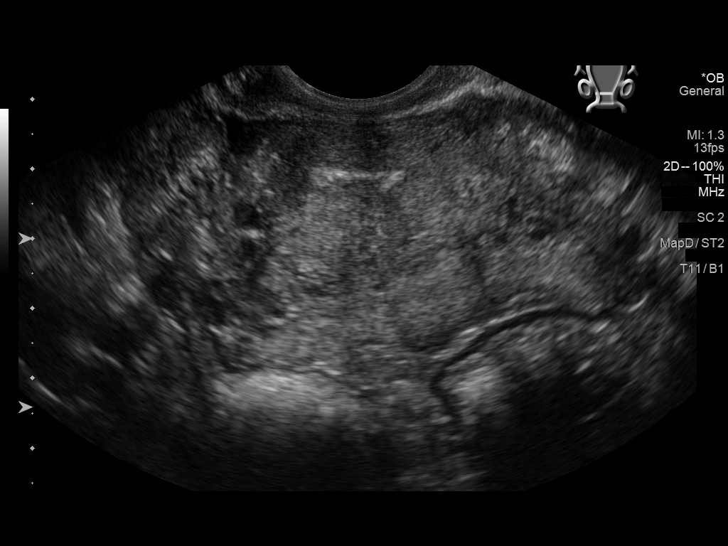
[im 61/83]
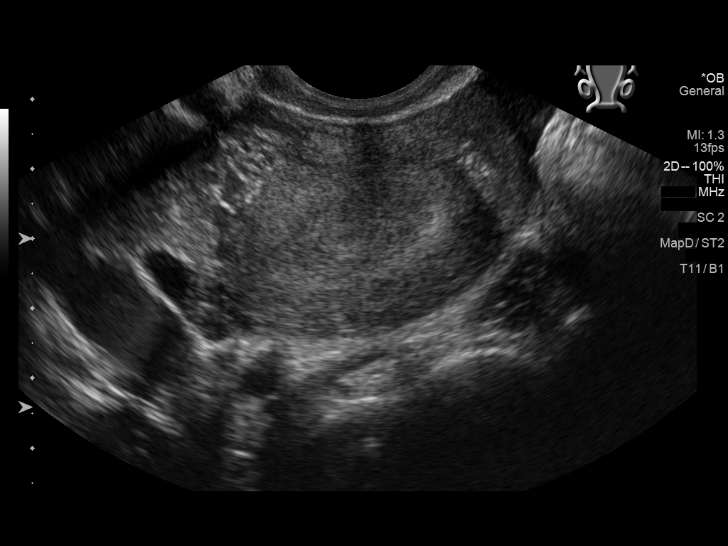
[im 67/83]
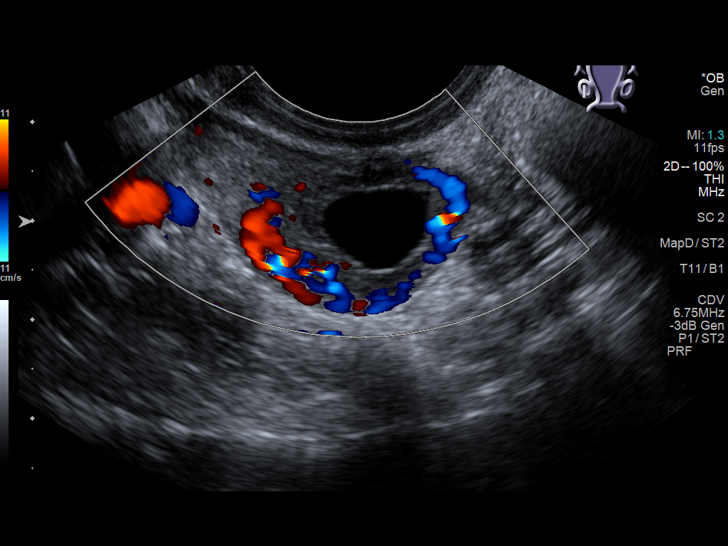
[im 73/83]
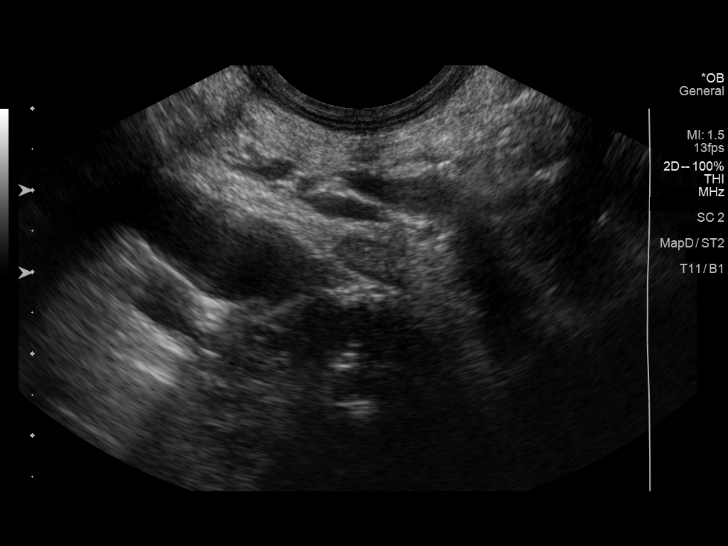
[im 79/83]
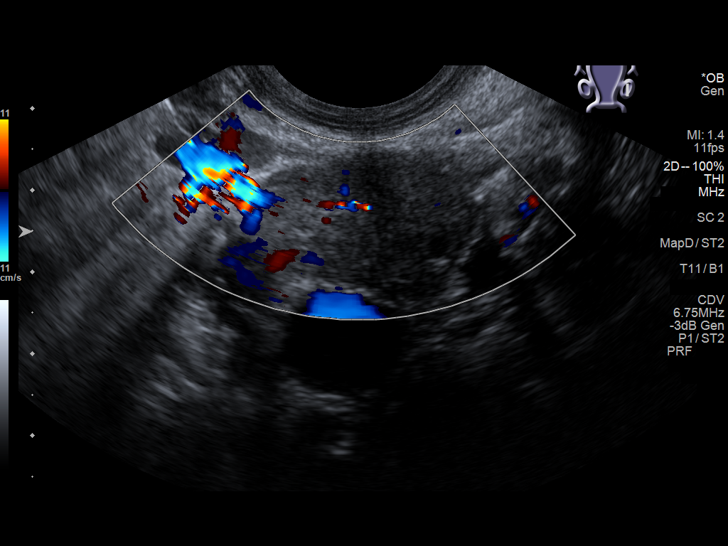

[13 of 28 positions shown; findings below may reference images not displayed]

FINDINGS: Intrauterine gestational sac: Single

Yolk sac:  Not Visualized.

Embryo:  Not Visualized.

Cardiac Activity: Not Visualized.

MSD: 17.9 mm 6 w 5 d, on 11/25/2019 there was an irregular
configured intrauterine fluid collection which measured 9.7 mm. No
definite embryo or yolk sac was noted at that time.

Maternal uterus/adnexae:

Subchorionic hemorrhage: Moderate size subchorionic hemorrhage
measuring 3.0 x 2.0 x 2.6 cm (volume = 8.2 cm^3).

Right ovary: Normal containing corpus luteum

Left ovary: Normal

Other :None

Free fluid:  Trace free fluid noted.
IMPRESSION: 1. Single intrauterine gestational sac without embryo or yolk sac
noted. On 11/25/2019 a probable intrauterine gestational sac without
yolk sac or embryo was noted noted. Findings are suspicious but not
yet definitive for failed pregnancy. Recommend follow-up US in 10-14
days for definitive diagnosis. This recommendation follows SRU
consensus guidelines: Diagnostic Criteria for Nonviable Pregnancy
Early in the First Trimester. N Engl J Med 0045; [DATE].
2. Moderate size subchorionic hematoma measuring 8.2 cc.

## 2021-06-01 DIAGNOSIS — R87810 Cervical high risk human papillomavirus (HPV) DNA test positive: Secondary | ICD-10-CM | POA: Insufficient documentation

## 2021-11-28 ENCOUNTER — Ambulatory Visit
Admission: EM | Admit: 2021-11-28 | Discharge: 2021-11-28 | Disposition: A | Discharge status: Home / Self Care | Payer: Medicaid Other | Attending: Urgent Care | Admitting: Urgent Care

## 2021-11-28 DIAGNOSIS — Z1152 Encounter for screening for COVID-19: Secondary | ICD-10-CM | POA: Insufficient documentation

## 2021-11-28 DIAGNOSIS — B3731 Acute candidiasis of vulva and vagina: Secondary | ICD-10-CM

## 2021-11-28 DIAGNOSIS — J019 Acute sinusitis, unspecified: Secondary | ICD-10-CM | POA: Insufficient documentation

## 2021-11-28 DIAGNOSIS — R051 Acute cough: Secondary | ICD-10-CM | POA: Diagnosis not present

## 2021-11-28 DIAGNOSIS — B9689 Other specified bacterial agents as the cause of diseases classified elsewhere: Secondary | ICD-10-CM | POA: Insufficient documentation

## 2021-11-28 DIAGNOSIS — H9203 Otalgia, bilateral: Secondary | ICD-10-CM | POA: Diagnosis not present

## 2021-11-28 DIAGNOSIS — J3489 Other specified disorders of nose and nasal sinuses: Secondary | ICD-10-CM | POA: Diagnosis not present

## 2021-11-28 LAB — RESP PANEL BY RT-PCR (RSV, FLU A&B, COVID)  RVPGX2
Influenza A by PCR: NEGATIVE
Influenza B by PCR: NEGATIVE
Resp Syncytial Virus by PCR: NEGATIVE
SARS Coronavirus 2 by RT PCR: NEGATIVE

## 2021-11-28 MED ORDER — FLUCONAZOLE 150 MG PO TABS: 150.0000 mg | ORAL_TABLET | Freq: Every day | ORAL | 0 refills | Status: DC

## 2021-11-28 MED ORDER — AMOXICILLIN-POT CLAVULANATE 875-125 MG PO TABS: 1.0000 | ORAL_TABLET | Freq: Two times a day (BID) | ORAL | 0 refills | Status: DC

## 2021-11-28 NOTE — Discharge Instructions (Signed)
Follow up here or with your primary care provider if your symptoms are worsening or not improving with treatment.     

## 2021-11-28 NOTE — ED Provider Notes (Signed)
[name]    CSN: [id number] Arrival date & time: 11/28/21  1225      History   Chief Complaint Chief Complaint  Patient presents with   Nasal Congestion   Cough    HPI Cathy Ortiz is a 31 y.o. female.    Cough   Presents to UC with complaint of cough and nasal congestion starting 11 days ago.  She reports the cough has resolved but the nasal congestion continues.  Feels burning in her nose and sinuses.  Also endorses loss of taste and smell about 3 days ago.  Denies fever, malaise, myalgias, chills.  Sinus pain/pressure.  Endorses some otalgia bilaterally.  Past Medical History:  Diagnosis Date   BV (bacterial vaginosis)    Recurrent   History of chlamydia    Seasonal allergies     Patient Active Problem List   Diagnosis Date Noted   Cervical high risk HPV (human papillomavirus) test positive 06/01/2021   History of polyhydramnios 03/10/2021   Anemia 02/09/2021   Contraception management 01/14/2021   History of gestational hypertension 01/14/2021   Supervision of normal pregnancy 11/14/2019   Postpartum care and examination 11/14/2019   PMDD (premenstrual dysphoric disorder) 10/24/2018   Left shoulder pain 03/06/2018   Depressive disorder 08/08/2017   Prediabetes 08/08/2017   Herpes simplex 05/28/2017   Breast nodule 01/20/2014   Heart murmur on physical examination 01/20/2014   Migraines 01/20/2014    Past Surgical History:  Procedure Laterality Date   WISDOM TOOTH EXTRACTION  2016/2017   ALL FOUR    OB History     Gravida  3   Para  2   Term  2   Preterm  0   AB  0   Living  2      SAB  0   IAB  0   Ectopic  0   Multiple  0   Live Births  2            Home Medications    Prior to Admission medications   Medication Sig Start Date End Date Taking? Authorizing Provider  acetaminophen (TYLENOL) 500 MG tablet Take by mouth. 05/03/21  Yes [provider]  acyclovir (ZOVIRAX) 400 MG tablet Take 1 tablet  every 8 hours by oral route for 7 days. 10/16/17  Yes [provider]  cyclobenzaprine (FLEXERIL) 5 MG tablet Take 1 tablet 3 times a day by oral route for 30 days. 03/06/18  Yes [provider]  escitalopram (LEXAPRO) 10 MG tablet Take 1 tablet by mouth daily. 05/03/21 08/12/22 Yes [provider]  medroxyPROGESTERone Acetate 150 MG/ML SUSY Inject into the muscle. 11/15/21 11/15/22 Yes [provider]  sucralfate (CARAFATE) 1 g tablet Take 1 tablet 4 times a day by oral route before meals for 30 days. 02/07/18  Yes [provider]  Calcium Carb-Cholecalciferol 600-400 MG-UNIT TABS Take by mouth.    [provider]  cetirizine (ZYRTEC) 10 MG tablet Take 10 mg by mouth daily.    [provider]  cholecalciferol (VITAMIN D3) 25 MCG (1000 UNIT) tablet Take 1 tablet (1,000 Units total) by mouth daily. 10/27/20   Nadara Mustard, MD  Ferrous Sulfate (IRON PO) Take 1 tablet by mouth daily.    [provider]  ondansetron (ZOFRAN ODT) 4 MG disintegrating tablet Take 1 tablet (4 mg total) by mouth every 6 (six) hours as needed for nausea. 10/27/20   Nadara Mustard, MD  Prenatal 27-1 MG TABS  Take 1 tablet by mouth daily.    [provider]  levonorgestrel (MIRENA) 20 MCG/24HR IUD 1 each by Intrauterine route once.  03/09/19  [provider]    Family History Family History  Problem Relation Age of Onset   Healthy Mother    Healthy Father    Cancer Maternal Grandmother 23       LUNG   Breast cancer Paternal Grandmother 24    Social History Social History   Tobacco Use   Smoking status: Never   Smokeless tobacco: Never  Vaping Use   Vaping Use: Never used  Substance Use Topics   Alcohol use: Not Currently   Drug use: Never     Allergies   Patient has no known allergies.   Review of Systems Review of Systems  Respiratory:  Positive for cough.      Physical Exam Triage Vital Signs ED Triage  Vitals [11/28/21 1235]  Enc Vitals Group     BP 121/80     Pulse Rate 87     Resp 18     Temp 97.9 F (36.6 C)     Temp src      SpO2 98 %     Weight      Height      Head Circumference      Peak Flow      Pain Score 0     Pain Loc      Pain Edu?      Excl. in GC?    No data found.  Updated Vital Signs BP 121/80   Pulse 87   Temp 97.9 F (36.6 C)   Resp 18   LMP  (LMP Unknown)   SpO2 98%   Breastfeeding No   Visual Acuity Right Eye Distance:   Left Eye Distance:   Bilateral Distance:    Right Eye Near:   Left Eye Near:    Bilateral Near:     Physical Exam Vitals reviewed.  Constitutional:      Appearance: Normal appearance.  HENT:     Right Ear: Tympanic membrane normal.     Left Ear: Tympanic membrane normal.     Mouth/Throat:     Mouth: Mucous membranes are moist.     Pharynx: Oropharynx is clear.  Cardiovascular:     Rate and Rhythm: Normal rate and regular rhythm.     Pulses: Normal pulses.     Heart sounds: Normal heart sounds.  Pulmonary:     Effort: Pulmonary effort is normal.     Breath sounds: Normal breath sounds.  Skin:    General: Skin is warm and dry.  Neurological:     General: No focal deficit present.     Mental Status: She is alert and oriented to person, place, and time.  Psychiatric:        Mood and Affect: Mood normal.        Behavior: Behavior normal.      UC Treatments / Results  Labs (all labs ordered are listed, but only abnormal results are displayed) Labs Reviewed  RESP PANEL BY RT-PCR (RSV, FLU A&B, COVID)  [id number]    EKG   Radiology No results found.  Procedures Procedures (including critical care time)  Medications Ordered in UC Medications - No data to display  Initial Impression / Assessment and Plan / UC Course  I have reviewed the triage vital signs and the nursing notes.  Pertinent labs & imaging results that were available during  my care of the patient were reviewed by me and considered in my  medical decision making (see chart for details).   URI symptoms greater than 10 days.  Suspect bacterial infection secondary to past URI.  Will treat with Augmentin twice daily x7 days.  Providing prescription for Diflucan at patient request for "inevitable" Candida vaginitis following treatment with antibiotics.   Final Clinical Impressions(s) / UC Diagnoses   Final diagnoses:  Acute cough   Discharge Instructions   None    ED Prescriptions   None    PDMP not reviewed this encounter.   Rose Phi, [place] 11/28/21 1249

## 2021-11-28 NOTE — ED Triage Notes (Signed)
Pt. Presents to UC w/ c/o a cough and nasal congestion that started 11 days ago. Pt. Also states 3 days ago she lost her taste and smell.

## 2022-01-28 IMAGING — US US OB < 14 WEEKS - US OB TV
1 series · 14 of 28 positions shown · non-contrast
Comparison: 09/03/2020

CLINICAL DATA: First trimester pregnancy, threatened miscarriage

EXAM:
OBSTETRIC <14 WK US AND TRANSVAGINAL OB US
TECHNIQUE: Both transabdominal and transvaginal ultrasound examinations were
performed for complete evaluation of the gestation as well as the
maternal uterus, adnexal regions, and pelvic cul-de-sac.
Transvaginal technique was performed to assess early pregnancy.

[Series 1: us pelvic complete with transvaginal · 69 acquisitions, 14 frames shown]
[im 3/69]
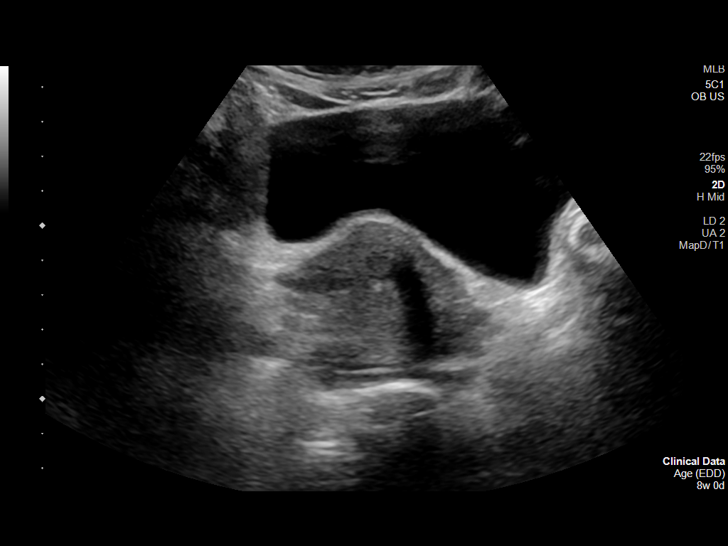
[im 8/69]
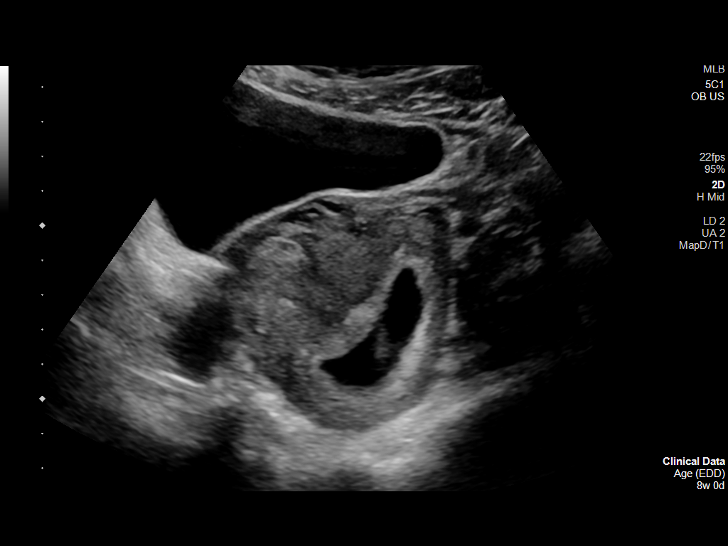
[im 13/69]
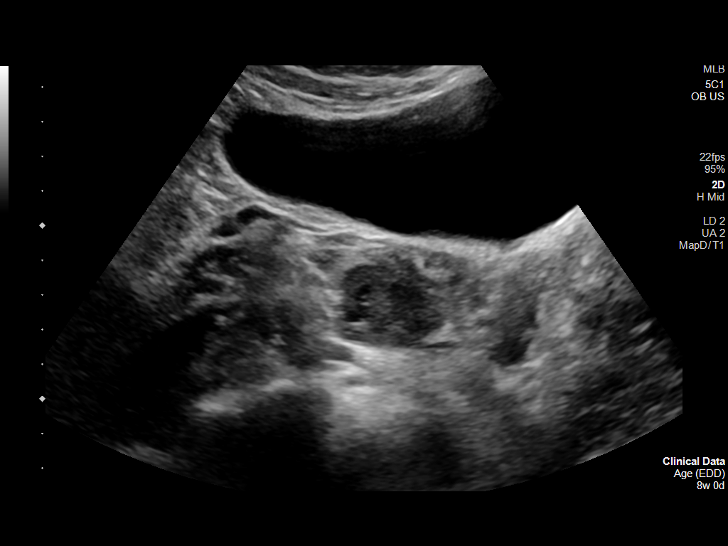
[im 18/69]
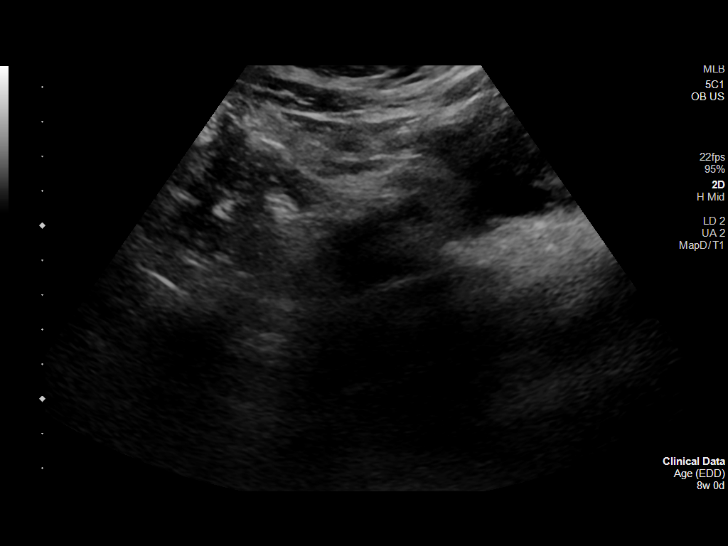
[im 23/69]
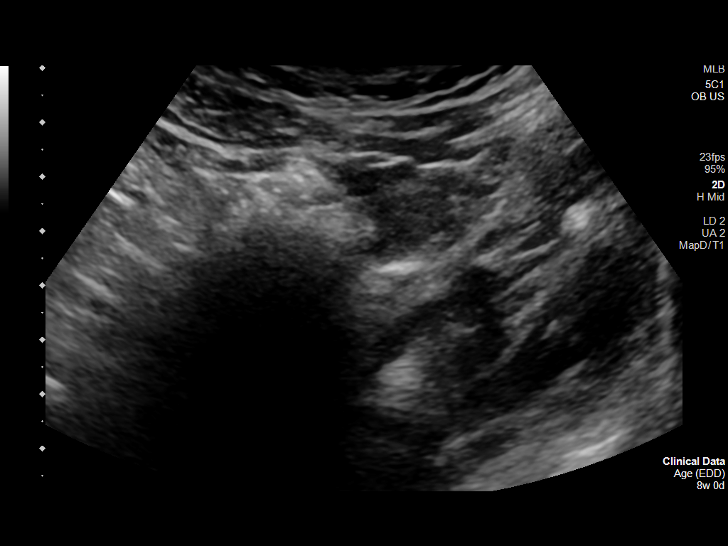
[im 28/69]
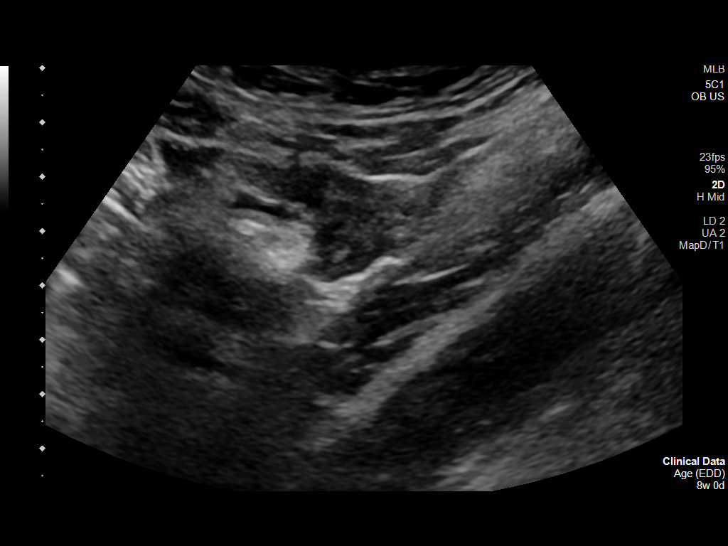
[im 33/69]
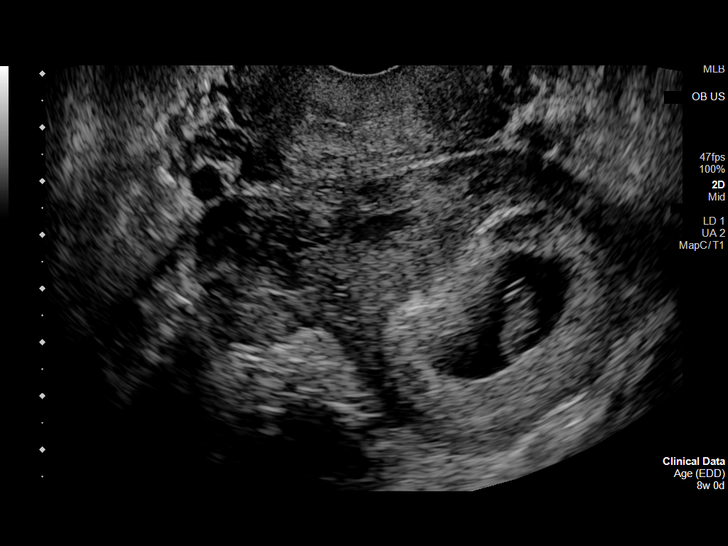
[im 38/69]
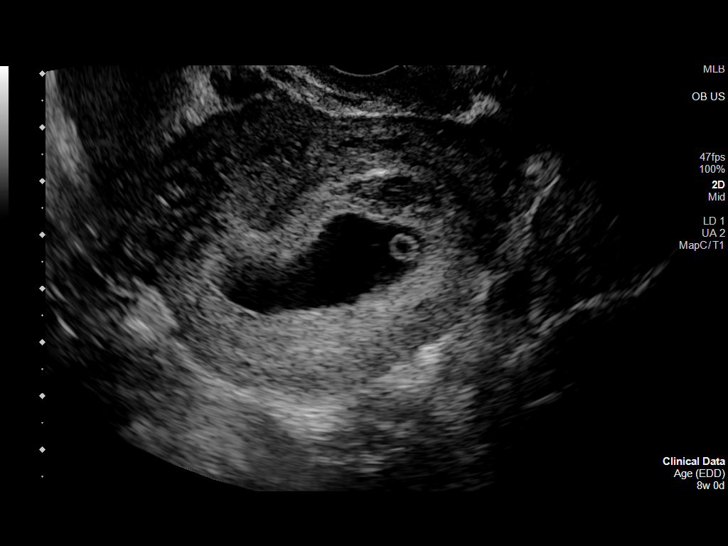
[im 43/69]
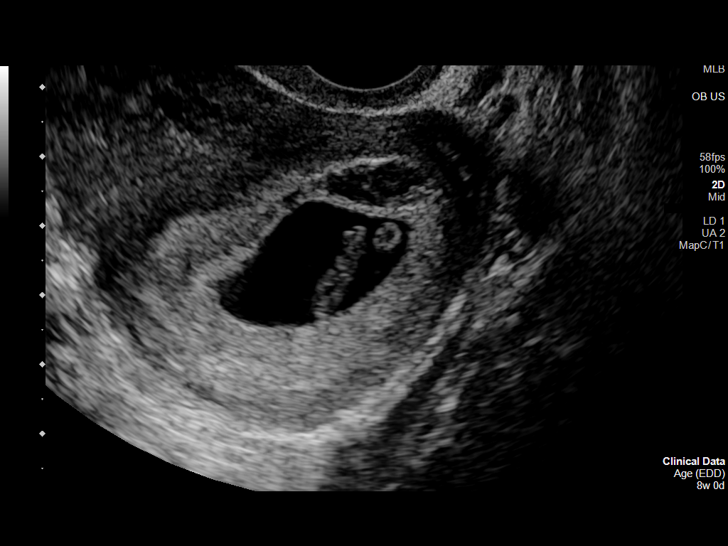
[im 48/69]
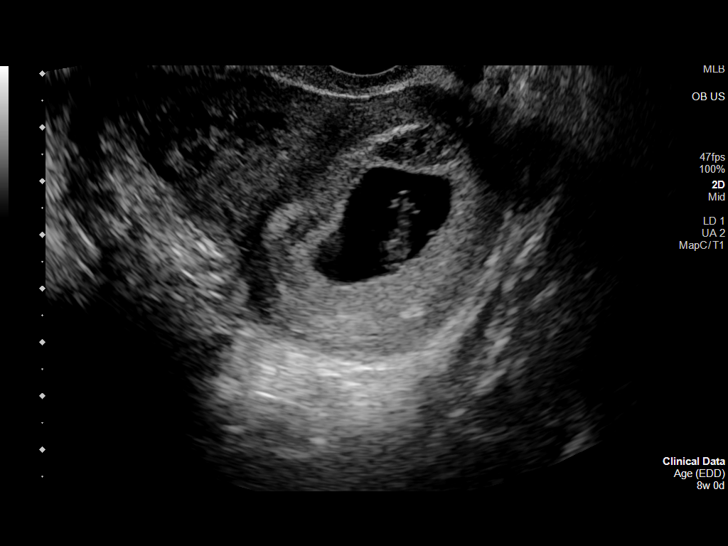
[im 53/69]
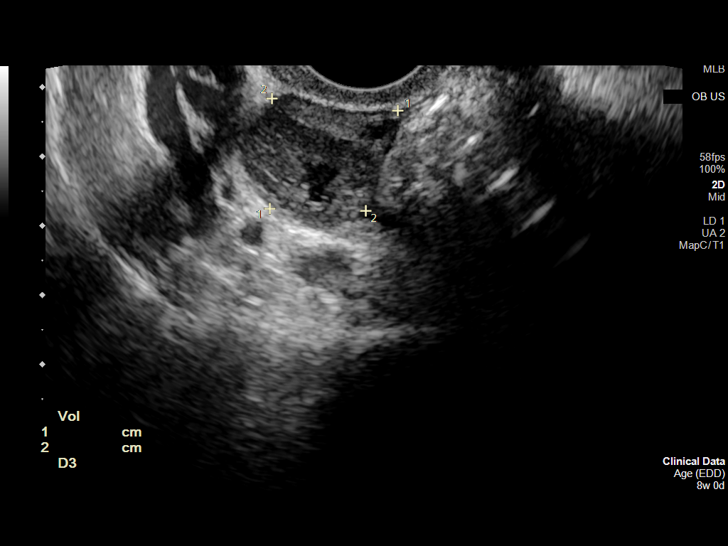
[im 58/69]
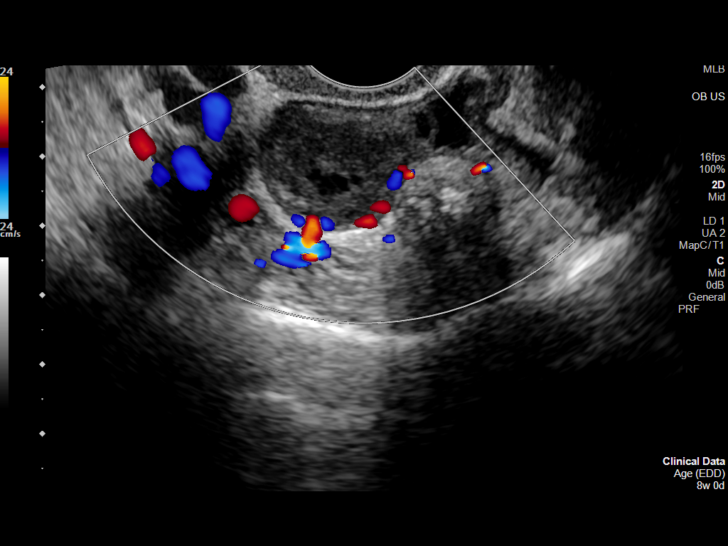
[im 63/69]
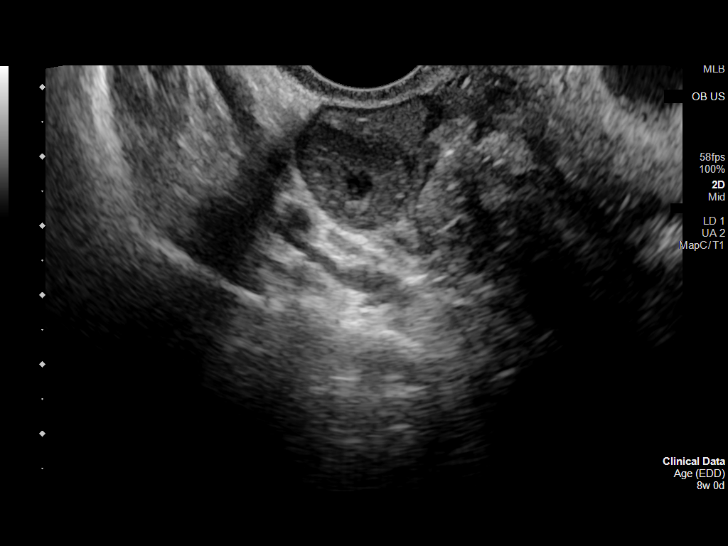
[im 69/69]
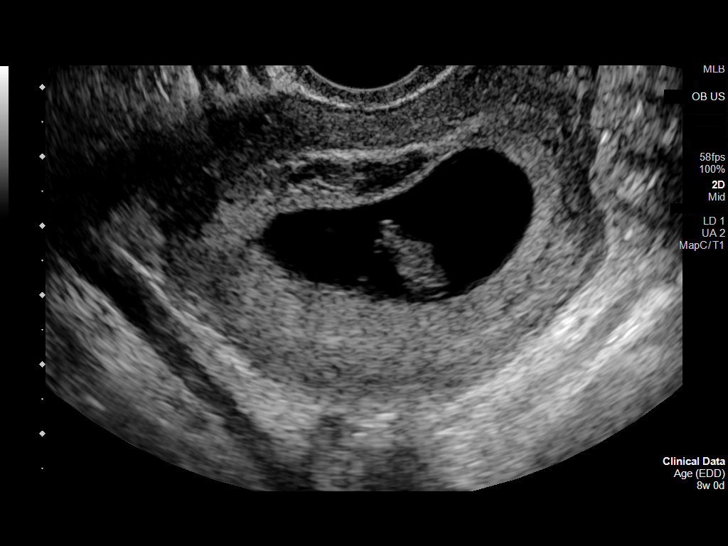

[14 of 28 positions shown; findings below may reference images not displayed]

FINDINGS: Intrauterine gestational sac: Present, single, elongated

Yolk sac:  Present

Embryo:  Present

Cardiac Activity: Present

Heart Rate: 153 bpm

CRL:  14.3 mm   7 w   5 d                  US EDC: 05/06/2021

Subchorionic hemorrhage:  None visualized.

Maternal uterus/adnexae:

RIGHT ovary measures 2.3 x 2.1 x 2.8 cm and contains a small corpus
luteum.

LEFT ovary normal size and morphology, 2.1 x 1.9 x 3.5 cm.

Trace free pelvic fluid adjacent to RIGHT ovary.

No adnexal masses.
IMPRESSION: Single live intrauterine gestation at 7 weeks 5 days EGA by
crown-rump length.

No acute abnormalities.

## 2022-02-22 NOTE — Progress Notes (Deleted)
    GYNECOLOGY OFFICE COLPOSCOPY PROCEDURE NOTE  32 y.o. [id number] here for colposcopy for  positive high risk hpv and positive for high risk ( Non 16/18)  pap smear on  08/31/2021 . Discussed role for HPV in cervical dysplasia, need for surveillance.  Patient gave informed written consent, time out was performed.  Placed in lithotomy position. Cervix viewed with speculum and colposcope after application of acetic acid.   Colposcopy adequate? {yes/no:20286}  {Findings; colposcopy:728}; corresponding biopsies obtained.  ECC specimen obtained. All specimens were labeled and sent to pathology.  Chaperone was present during entire procedure.  Patient was given post procedure instructions.  Will follow up pathology and manage accordingly; patient will be contacted with results and recommendations.  Routine preventative health maintenance measures emphasized.    Rubie Maid, MD [place]

## 2022-02-24 ENCOUNTER — Telehealth: Payer: Self-pay | Admitting: Obstetrics and Gynecology

## 2022-02-24 ENCOUNTER — Ambulatory Visit: Payer: Medicaid Other | Admitting: Obstetrics and Gynecology

## 2022-02-24 NOTE — Telephone Encounter (Signed)
Reached out to pt to reschedule procedure (colpo) that was scheduled for 02/24/2022 at 10:15 with Dr. Marcelline Mates.  Could not leave message, mailbox was full.

## 2022-02-28 NOTE — Telephone Encounter (Signed)
Pt is scheduled with Dr. Marcelline Mates on 04/18/22 for the colpo.

## 2022-04-14 NOTE — Progress Notes (Unsigned)
   GYNECOLOGY OFFICE COLPOSCOPY PROCEDURE NOTE  32 y.o. [id number] here for colposcopy for  NILM AND POSITIVE FOR HIGH RISK HPV  pap smear on 08/31/2021. Has history of similar pap smear in 2021 during her pregnancy.  Discussed role for HPV in cervical dysplasia, need for surveillance.  Referred by her PCP at [facility] [place], [place])  Patient gave informed written consent, time out was performed.  Placed in lithotomy position. Cervix viewed with speculum and colposcope after application of acetic acid.   Colposcopy adequate? Yes  no visible lesions, no mosaicism, no punctation, and no abnormal vasculature; no corresponding biopsies obtained.  ECC specimen obtained. All specimens were labeled and sent to pathology.  Chaperone was present during entire procedure.  Patient was given post procedure instructions.  Will follow up pathology and manage accordingly; patient will be contacted with results and recommendations.  Routine preventative health maintenance measures emphasized.    Rubie Maid, MD [place]

## 2022-04-18 ENCOUNTER — Encounter: Payer: Self-pay | Admitting: Obstetrics and Gynecology

## 2022-04-18 ENCOUNTER — Other Ambulatory Visit (HOSPITAL_COMMUNITY)
Admission: RE | Admit: 2022-04-18 | Discharge: 2022-04-18 | Disposition: A | Discharge status: Home / Self Care | Payer: Medicaid Other | Source: Ambulatory Visit | Attending: Obstetrics and Gynecology | Admitting: Obstetrics and Gynecology

## 2022-04-18 ENCOUNTER — Ambulatory Visit (INDEPENDENT_AMBULATORY_CARE_PROVIDER_SITE_OTHER): Payer: Medicaid Other | Admitting: Obstetrics and Gynecology

## 2022-04-18 VITALS — BP 122/68 | HR 88 | Resp 16 | Ht 61.0 in | Wt 172.0 lb

## 2022-04-18 DIAGNOSIS — R87810 Cervical high risk human papillomavirus (HPV) DNA test positive: Secondary | ICD-10-CM

## 2022-04-19 ENCOUNTER — Encounter: Payer: Self-pay | Admitting: Obstetrics and Gynecology

## 2022-04-20 LAB — SURGICAL PATHOLOGY

## 2022-04-30 IMAGING — US US OB FOLLOW-UP
1 series · 13 of 28 positions shown · non-contrast
Comparison: none

CLINICAL DATA: Follow-up evaluation for fetal anatomy and growth.

EXAM:
OBSTETRICAL ULTRASOUND >14 WKS

[Series 1: us ob follow up · 13 of 55 slices shown]
[im 3/55]
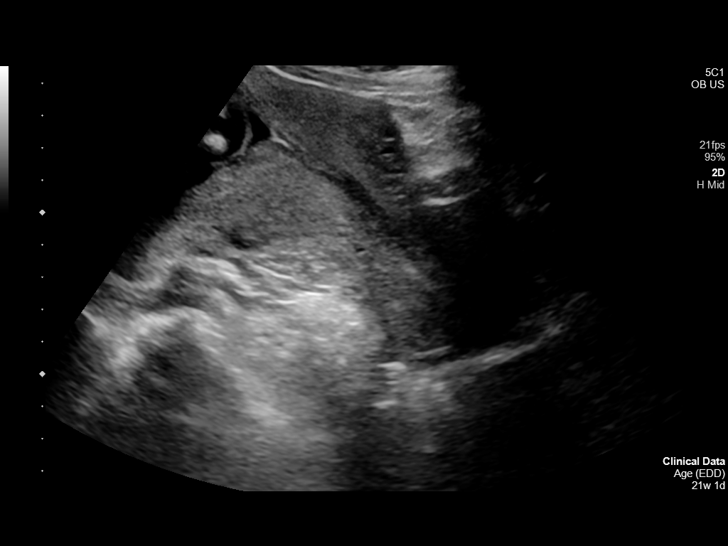
[im 7/55]
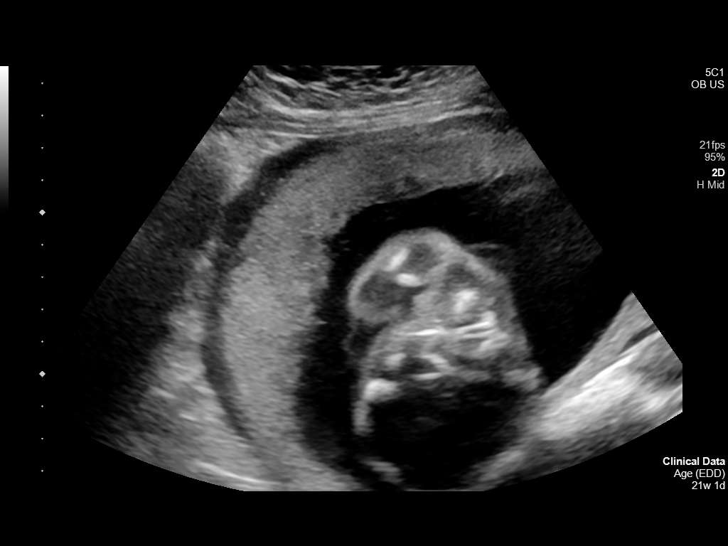
[im 11/55]
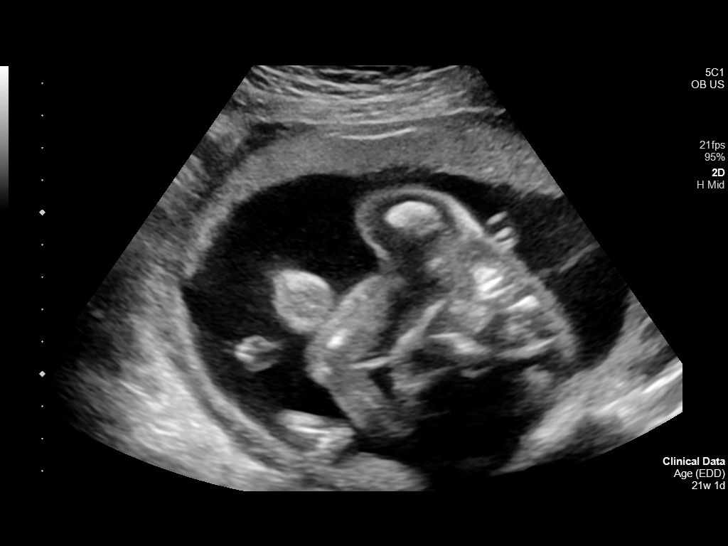
[im 15/55]
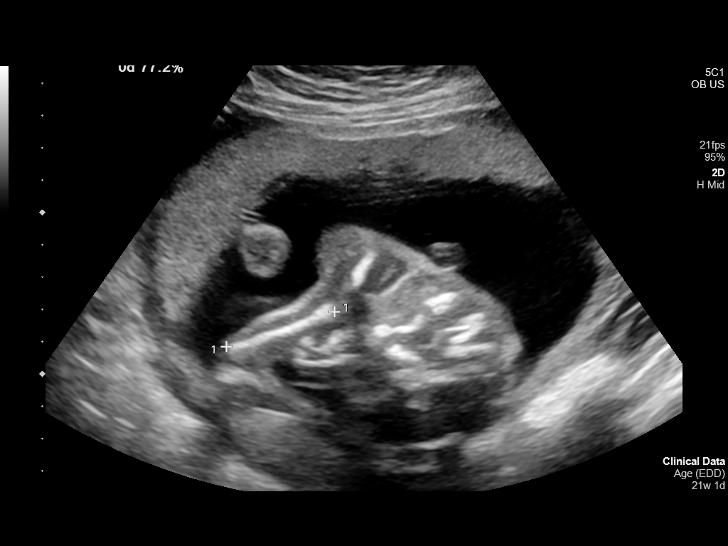
[im 19/55]
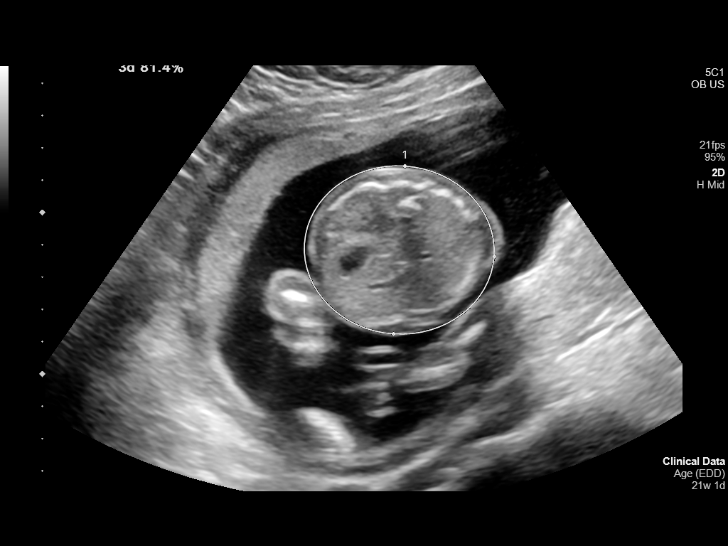
[im 23/55]
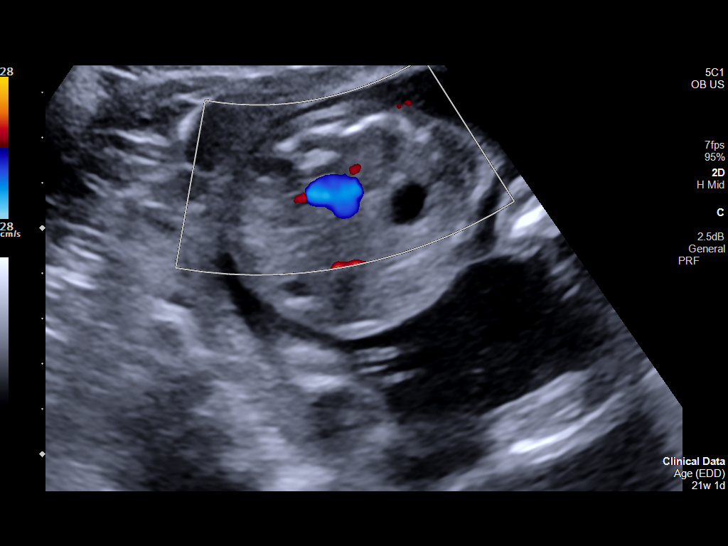
[im 29/55]
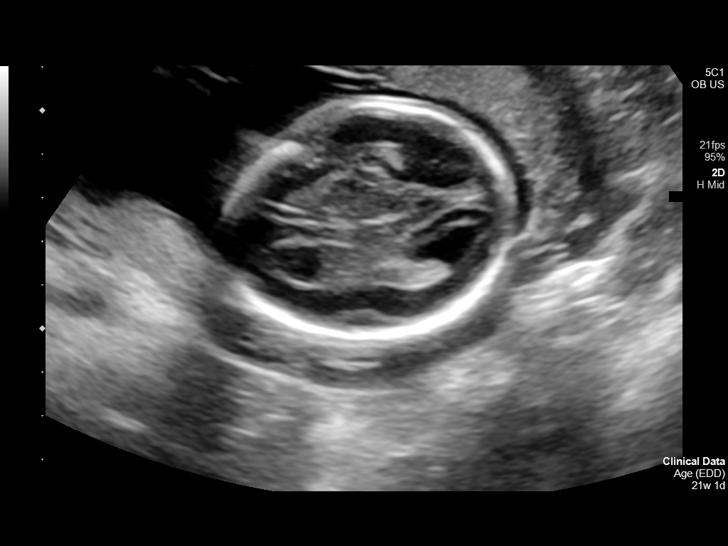
[im 33/55]
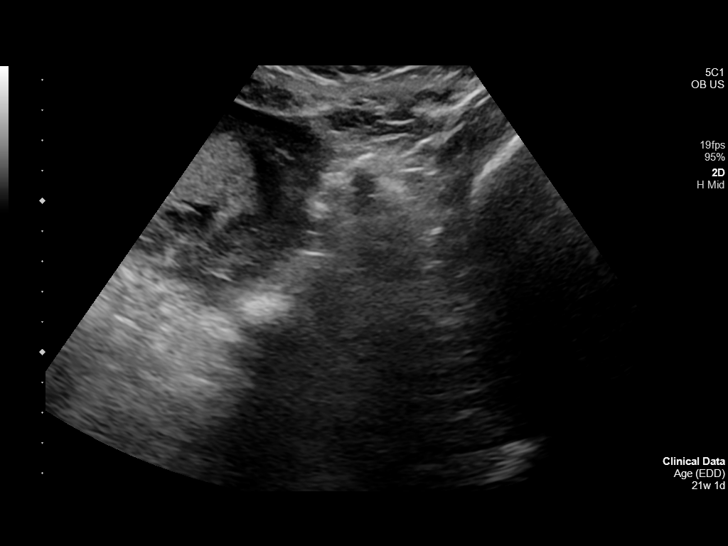
[im 37/55]
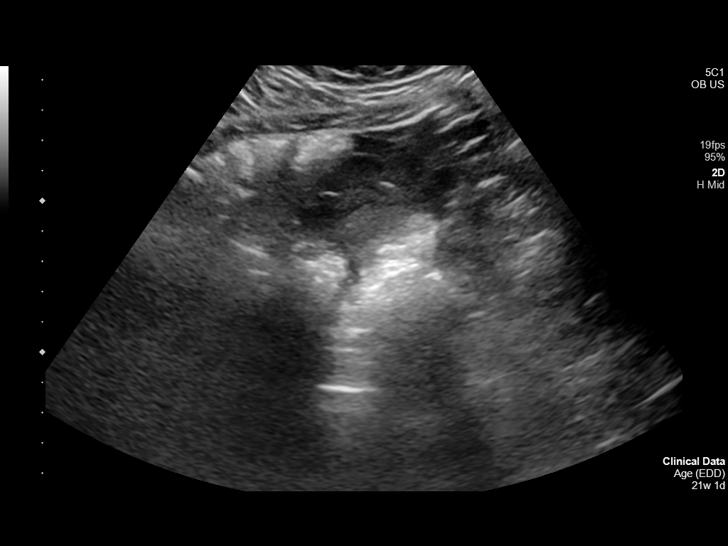
[im 41/55]
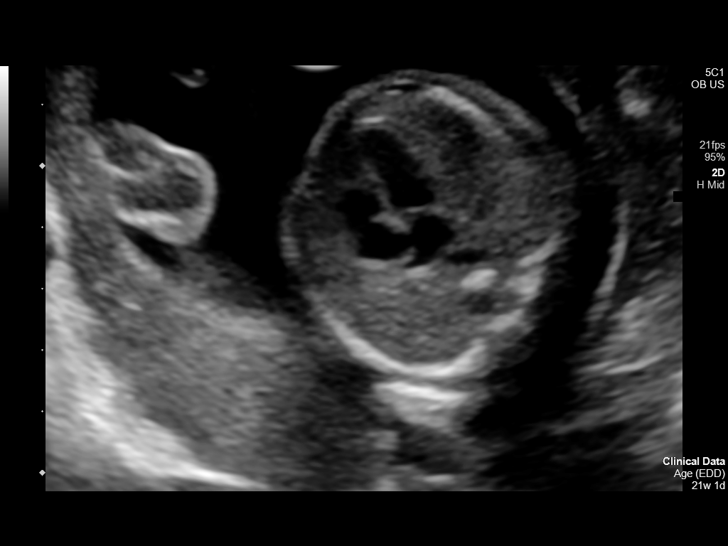
[im 45/55]
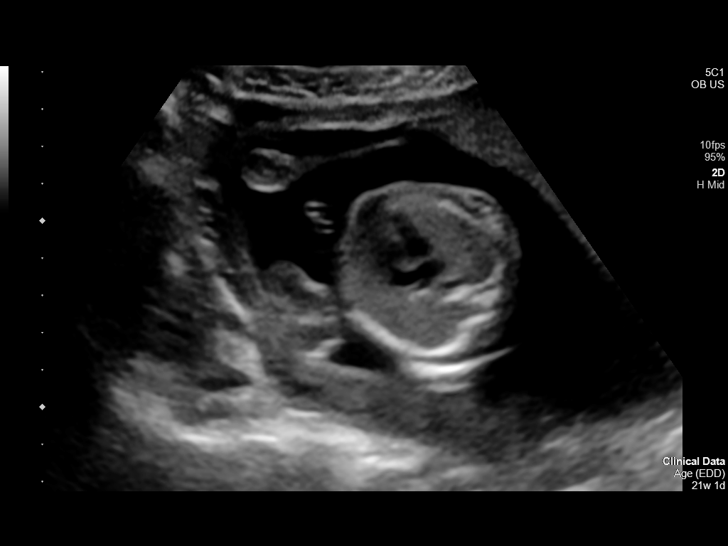
[im 49/55]
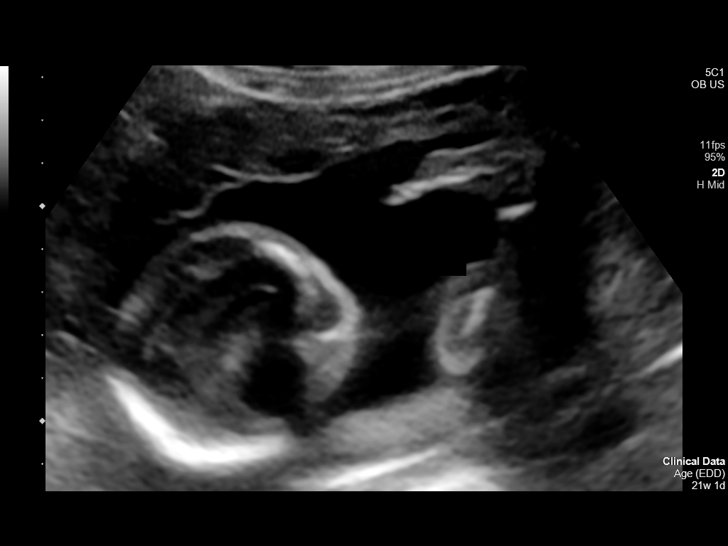
[im 53/55]
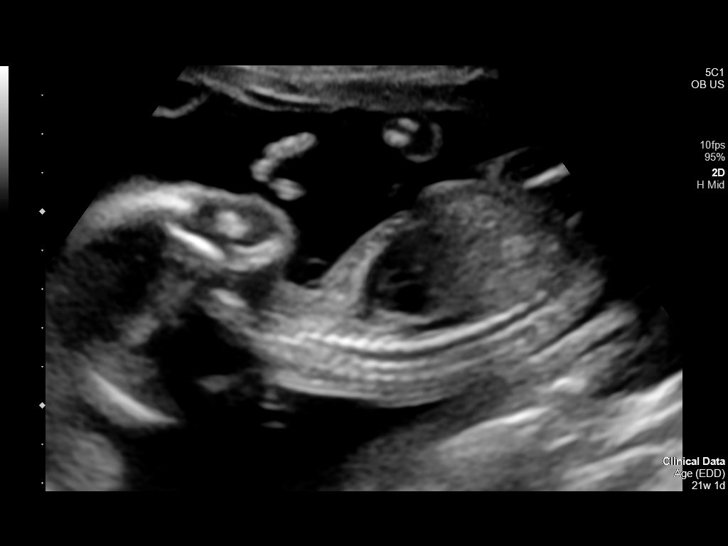

[13 of 28 positions shown; findings below may reference images not displayed]

FINDINGS: Number of Fetuses: 1

Heart Rate:  153 bpm

Movement: Yes

Presentation: Transverse lie with head to maternal left

Previa: No

Placental Location: Anterior

Amniotic Fluid (Subjective): Within normal limits

Amniotic Fluid (Objective):

Vertical pocket = 4.3cm

FETAL BIOMETRY

BPD: 4.9cm 21w 0d

HC:   18.9cm 21w 1d

AC:   16.9cm 21w 6d

FL:   3.5cm 21w 0d

Current Mean GA: 21w 2d US EDC: 05/03/2021

Assigned GA:  21w 0d Assigned EDC: 05/04/2021

FETAL ANATOMY

Lateral Ventricles: Appears normal

Thalami/CSP: Appears normal

Posterior Fossa:  Previously seen

Nuchal Region: Previously seen

Upper Lip: Previously seen

Spine: Previously seen

4 Chamber Heart on Left: Appears normal

LVOT: Previously seen

RVOT: Previously seen

Stomach on Left: Appears normal

3 Vessel Cord: Previously seen

Cord Insertion site: Previously seen

Kidneys: Appears normal

Bladder: Appears normal

Extremities: Previously seen

Sex: Previously Seen

Technically difficult due to: Fetal position

Maternal Findings:

Cervix:  3.6 cm TA
IMPRESSION: Assigned GA currently 21 weeks 0 days.  Appropriate fetal growth.

Fetal four-chamber heart was visualized and appears normal. No
abnormality seen involving visualized fetal anatomy.

## 2022-08-07 ENCOUNTER — Ambulatory Visit: Payer: Self-pay

## 2022-10-03 ENCOUNTER — Ambulatory Visit
Admission: EM | Admit: 2022-10-03 | Discharge: 2022-10-03 | Disposition: A | Discharge status: Home / Self Care | Payer: Medicaid Other | Attending: Emergency Medicine | Admitting: Emergency Medicine

## 2022-10-03 DIAGNOSIS — Z1152 Encounter for screening for COVID-19: Secondary | ICD-10-CM | POA: Insufficient documentation

## 2022-10-03 DIAGNOSIS — B349 Viral infection, unspecified: Secondary | ICD-10-CM

## 2022-10-03 LAB — POCT RAPID STREP A (OFFICE): Rapid Strep A Screen: NEGATIVE

## 2022-10-03 MED ORDER — NYSTATIN 100000 UNIT/ML MT SUSP: 5.0000 mL | Freq: Four times a day (QID) | OROMUCOSAL | 0 refills | Status: AC | PRN

## 2022-10-03 NOTE — ED Triage Notes (Signed)
Patient to Urgent Care with complaints of sore throat/ painful swallowing.  Symptoms started on Sunday. Started having body aches/ chills yesterday.  Has been taking ibuprofen- last dose last night.

## 2022-10-03 NOTE — Discharge Instructions (Addendum)
Your symptoms today are most likely being caused by a virus and should steadily improve in time it can take up to 7 to 10 days before you truly start to see a turnaround however things will get better  Strep testing is negative, has been sent to the lab to see if bacteria will grow, if this occurs you will be notified and antibiotics sent to pharmacy  COVID test is pending up to 24 hours, you will be notified of positive test results only, if positive you will need to quarantine until fever has resolved, if no fever you may continue activity as normal wearing mask until all symptoms have resolved  Gargle and spit Magic mouthwash solution every 4-6 hours to provide temporary relief to your throat     You can take Tylenol and/or Ibuprofen as needed for fever reduction and pain relief.   For cough: honey 1/2 to 1 teaspoon (you can dilute the honey in water or another fluid).  You can also use guaifenesin and dextromethorphan for cough. You can use a humidifier for chest congestion and cough.  If you don't have a humidifier, you can sit in the bathroom with the hot shower running.      For sore throat: try warm salt water gargles, cepacol lozenges, throat spray, warm tea or water with lemon/honey, popsicles or ice, or OTC cold relief medicine for throat discomfort.   For congestion: take a daily anti-histamine like Zyrtec, Claritin, and a oral decongestant, such as pseudoephedrine.  You can also use Flonase 1-2 sprays in each nostril daily.   It is important to stay hydrated: drink plenty of fluids (water, gatorade/powerade/pedialyte, juices, or teas) to keep your throat moisturized and help further relieve irritation/discomfort.

## 2022-10-03 NOTE — ED Provider Notes (Signed)
Cathy Ortiz    CSN: [id number] Arrival date & time: 10/03/22  3086      History   Chief Complaint Chief Complaint  Patient presents with   Fever   Generalized Body Aches   Sore Throat    HPI Cathy Ortiz is a 32 y.o. female.   Patient presents for evaluation of sore throat with exudate present for 2 days, began to experience body aches and chills 1 day ago.  Managing symptoms with ibuprofen.  No known sick contacts.  Tolerating food and liquids.  Denies ear pain, congestion, cough or abdominal symptoms.   Patient Active Problem List   Diagnosis Date Noted   Cervical high risk HPV (human papillomavirus) test positive 06/01/2021   History of polyhydramnios 03/10/2021   Anemia 02/09/2021   Contraception management 01/14/2021   History of gestational hypertension 01/14/2021   Supervision of normal pregnancy 11/14/2019   Postpartum care and examination 11/14/2019   PMDD (premenstrual dysphoric disorder) 10/24/2018   Left shoulder pain 03/06/2018   Depressive disorder 08/08/2017   Prediabetes 08/08/2017   Herpes simplex 05/28/2017   Breast nodule 01/20/2014   Heart murmur on physical examination 01/20/2014   Migraines 01/20/2014    Past Surgical History:  Procedure Laterality Date   WISDOM TOOTH EXTRACTION  2016/2017   ALL FOUR    OB History     Gravida  3   Para  2   Term  2   Preterm  0   AB  0   Living  2      SAB  0   IAB  0   Ectopic  0   Multiple  0   Live Births  2            Home Medications    Prior to Admission medications   Medication Sig Start Date End Date Taking? Authorizing Provider  magic mouthwash (nystatin, lidocaine, diphenhydrAMINE, alum & mag hydroxide) suspension Swish and spit 5 mLs 4 (four) times daily as needed for mouth pain. 10/03/22  Yes Paz Fuentes R, NP  acetaminophen (TYLENOL) 500 MG tablet Take by mouth. 05/03/21   [provider]  Calcium Carb-Cholecalciferol 600-400 MG-UNIT TABS  Take by mouth.    [provider]  cetirizine (ZYRTEC) 10 MG tablet Take 10 mg by mouth daily.    [provider]  cholecalciferol (VITAMIN D3) 25 MCG (1000 UNIT) tablet Take 1 tablet (1,000 Units total) by mouth daily. 10/27/20   Nadara Mustard, MD  escitalopram (LEXAPRO) 10 MG tablet Take 1 tablet by mouth daily. 05/03/21 08/12/22  [provider]  escitalopram (LEXAPRO) 20 MG tablet Take 20 mg by mouth daily.    [provider]  fexofenadine (ALLEGRA) 180 MG tablet 180 mg daily. 02/20/22 02/20/23  [provider]  medroxyPROGESTERone Acetate 150 MG/ML SUSY Inject into the muscle. 11/15/21 11/15/22  [provider]  Prenatal 27-1 MG TABS Take 1 tablet by mouth daily.    [provider]  levonorgestrel (MIRENA) 20 MCG/24HR IUD 1 each by Intrauterine route once.  03/09/19  [provider]    Family History Family History  Problem Relation Age of Onset   Healthy Mother    Healthy Father    Cancer Maternal Grandmother 39       LUNG   Breast cancer Paternal Grandmother 40    Social History Social History   Tobacco Use   Smoking status: Never   Smokeless tobacco: Never  Vaping Use  Vaping status: Never Used  Substance Use Topics   Alcohol use: Not Currently   Drug use: Never     Allergies   Patient has no known allergies.   Review of Systems Review of Systems   Physical Exam Triage Vital Signs ED Triage Vitals  Encounter Vitals Group     BP 10/03/22 0852 129/84     Systolic BP Percentile --      Diastolic BP Percentile --      Pulse Rate 10/03/22 0852 99     Resp 10/03/22 0852 16     Temp 10/03/22 0852 (!) 100.5 F (38.1 C)     Temp Source 10/03/22 0852 Oral     SpO2 10/03/22 0852 98 %     Weight --      Height --      Head Circumference --      Peak Flow --      Pain Score 10/03/22 0856 4     Pain Loc --      Pain Education --      Exclude from Growth Chart --    No data  found.  Updated Vital Signs BP 129/84 (BP Location: Left Arm)   Pulse 99   Temp (!) 100.5 F (38.1 C) (Oral)   Resp 16   SpO2 98%   Visual Acuity Right Eye Distance:   Left Eye Distance:   Bilateral Distance:    Right Eye Near:   Left Eye Near:    Bilateral Near:     Physical Exam Constitutional:      Appearance: She is well-developed.  HENT:     Right Ear: Tympanic membrane and ear canal normal.     Left Ear: Tympanic membrane and ear canal normal.     Nose: No congestion or rhinorrhea.     Mouth/Throat:     Mouth: Mucous membranes are moist.     Pharynx: Posterior oropharyngeal erythema present.     Tonsils: No tonsillar exudate.  Cardiovascular:     Rate and Rhythm: Normal rate and regular rhythm.     Heart sounds: Normal heart sounds.  Pulmonary:     Effort: Pulmonary effort is normal.     Breath sounds: Normal breath sounds.  Musculoskeletal:     Cervical back: Normal range of motion.  Lymphadenopathy:     Cervical: Cervical adenopathy present.  Skin:    General: Skin is warm and dry.  Neurological:     General: No focal deficit present.     Mental Status: She is alert and oriented to person, place, and time.      UC Treatments / Results  Labs (all labs ordered are listed, but only abnormal results are displayed) Labs Reviewed  CULTURE, GROUP A STREP ([facility])  SARS CORONAVIRUS 2 (TAT 6-24 HRS)  POCT RAPID STREP A (OFFICE)    EKG   Radiology No results found.  Procedures Procedures (including critical care time)  Medications Ordered in UC Medications - No data to display  Initial Impression / Assessment and Plan / UC Course  I have reviewed the triage vital signs and the nursing notes.  Pertinent labs & imaging results that were available during my care of the patient were reviewed by me and considered in my medical decision making (see chart for details).  Viral illness  Patient is in no signs of distress nor toxic appearing.  Vital  signs are stable.  Low suspicion for pneumonia, pneumothorax or bronchitis and therefore will defer  imaging.  Rapid Strep test negative sent for culture. COVID test is pending, reviewed quarantine guidelines per CDC recommendations healthy adult, minimal comorbidities, if does not qualify for antiviral.Prescribed  Magic mouthwash. May use additional over-the-counter medications as needed for supportive care.  May follow-up with urgent care as needed if symptoms persist or worsen.   Final Clinical Impressions(s) / UC Diagnoses   Final diagnoses:  Viral illness     Discharge Instructions      Your symptoms today are most likely being caused by a virus and should steadily improve in time it can take up to 7 to 10 days before you truly start to see a turnaround however things will get better  Strep testing is negative, has been sent to the lab to see if bacteria will grow, if this occurs you will be notified and antibiotics sent to pharmacy  COVID test is pending up to 24 hours, you will be notified of positive test results only, if positive you will need to quarantine until fever has resolved, if no fever you may continue activity as normal wearing mask until all symptoms have resolved  Gargle and spit Magic mouthwash solution every 4-6 hours to provide temporary relief to your throat     You can take Tylenol and/or Ibuprofen as needed for fever reduction and pain relief.   For cough: honey 1/2 to 1 teaspoon (you can dilute the honey in water or another fluid).  You can also use guaifenesin and dextromethorphan for cough. You can use a humidifier for chest congestion and cough.  If you don't have a humidifier, you can sit in the bathroom with the hot shower running.      For sore throat: try warm salt water gargles, cepacol lozenges, throat spray, warm tea or water with lemon/honey, popsicles or ice, or OTC cold relief medicine for throat discomfort.   For congestion: take a daily  anti-histamine like Zyrtec, Claritin, and a oral decongestant, such as pseudoephedrine.  You can also use Flonase 1-2 sprays in each nostril daily.   It is important to stay hydrated: drink plenty of fluids (water, gatorade/powerade/pedialyte, juices, or teas) to keep your throat moisturized and help further relieve irritation/discomfort.    ED Prescriptions     Medication Sig Dispense Auth. Provider   magic mouthwash (nystatin, lidocaine, diphenhydrAMINE, alum & mag hydroxide) suspension Swish and spit 5 mLs 4 (four) times daily as needed for mouth pain. 180 mL Valinda Hoar, NP      PDMP not reviewed this encounter.   Valinda Hoar, [place] 10/03/22 [phone]

## 2022-10-04 LAB — SARS CORONAVIRUS 2 (TAT 6-24 HRS): SARS Coronavirus 2: NEGATIVE

## 2022-10-05 LAB — CULTURE, GROUP A STREP (THRC)

## 2023-01-03 ENCOUNTER — Ambulatory Visit
Admission: EM | Admit: 2023-01-03 | Discharge: 2023-01-03 | Disposition: A | Discharge status: Home / Self Care | Payer: Medicaid Other | Attending: Emergency Medicine | Admitting: Emergency Medicine

## 2023-01-03 DIAGNOSIS — J069 Acute upper respiratory infection, unspecified: Secondary | ICD-10-CM | POA: Diagnosis not present

## 2023-01-03 LAB — POCT RAPID STREP A (OFFICE): Rapid Strep A Screen: NEGATIVE

## 2023-01-03 LAB — POC COVID19/FLU A&B COMBO
Covid Antigen, POC: NEGATIVE
Influenza A Antigen, POC: NEGATIVE
Influenza B Antigen, POC: NEGATIVE

## 2023-01-03 NOTE — Discharge Instructions (Signed)
Your symptoms today are most likely being caused by a virus and should steadily improve in time it can take up to 7 to 10 days before you truly start to see a turnaround however things will get better  COVID, flu and strep testing negative    You can take Tylenol and/or Ibuprofen as needed for fever reduction and pain relief.   For cough: honey 1/2 to 1 teaspoon (you can dilute the honey in water or another fluid).  You can also use guaifenesin and dextromethorphan for cough. You can use a humidifier for chest congestion and cough.  If you don't have a humidifier, you can sit in the bathroom with the hot shower running.      For sore throat: try warm salt water gargles, cepacol lozenges, throat spray, warm tea or water with lemon/honey, popsicles or ice, or OTC cold relief medicine for throat discomfort.   For congestion: take a daily anti-histamine like Zyrtec, Claritin, and a oral decongestant, such as pseudoephedrine.  You can also use Flonase 1-2 sprays in each nostril daily.   It is important to stay hydrated: drink plenty of fluids (water, gatorade/powerade/pedialyte, juices, or teas) to keep your throat moisturized and help further relieve irritation/discomfort.

## 2023-01-03 NOTE — ED Triage Notes (Signed)
Patient presents to UC for sore throat and body aches x 3 days. Employer req her to be tested for covid/flu. Exposed to strep.

## 2023-01-03 NOTE — ED Provider Notes (Signed)
[name]    CSN: [id number] Arrival date & time: 01/03/23  1235      History   Chief Complaint Chief Complaint  Patient presents with   Sore Throat   Generalized Body Aches    HPI Cathy Ortiz is a 32 y.o. female.   Patient presents for evaluation of sore throat, body aches, intermittent generalized headaches and nasal congestion present for 3 days.  Known exposure to strep.  Job requiring COVID and flu testing to return.  Has attempted use of ibuprofen and Tylenol.  Tolerating food and liquids.  Past Medical History:  Diagnosis Date   BV (bacterial vaginosis)    Recurrent   History of chlamydia    Seasonal allergies     Patient Active Problem List   Diagnosis Date Noted   Cervical high risk HPV (human papillomavirus) test positive 06/01/2021   History of polyhydramnios 03/10/2021   Anemia 02/09/2021   Contraception management 01/14/2021   History of gestational hypertension 01/14/2021   Supervision of normal pregnancy 11/14/2019   Postpartum care and examination 11/14/2019   PMDD (premenstrual dysphoric disorder) 10/24/2018   Left shoulder pain 03/06/2018   Depressive disorder 08/08/2017   Prediabetes 08/08/2017   Herpes simplex 05/28/2017   Breast nodule 01/20/2014   Heart murmur on physical examination 01/20/2014   Migraines 01/20/2014    Past Surgical History:  Procedure Laterality Date   WISDOM TOOTH EXTRACTION  2016/2017   ALL FOUR    OB History     Gravida  3   Para  2   Term  2   Preterm  0   AB  0   Living  2      SAB  0   IAB  0   Ectopic  0   Multiple  0   Live Births  2            Home Medications    Prior to Admission medications   Medication Sig Start Date End Date Taking? Authorizing Provider  acetaminophen (TYLENOL) 500 MG tablet Take by mouth. 05/03/21   [provider]  Calcium Carb-Cholecalciferol 600-400 MG-UNIT TABS Take by mouth.    [provider]  cetirizine (ZYRTEC) 10  MG tablet Take 10 mg by mouth daily.    [provider]  cholecalciferol (VITAMIN D3) 25 MCG (1000 UNIT) tablet Take 1 tablet (1,000 Units total) by mouth daily. 10/27/20   Nadara Mustard, MD  escitalopram (LEXAPRO) 10 MG tablet Take 1 tablet by mouth daily. 05/03/21 08/12/22  [provider]  escitalopram (LEXAPRO) 20 MG tablet Take 20 mg by mouth daily.    [provider]  fexofenadine (ALLEGRA) 180 MG tablet 180 mg daily. 02/20/22 02/20/23  [provider]  magic mouthwash (nystatin, lidocaine, diphenhydrAMINE, alum & mag hydroxide) suspension Swish and spit 5 mLs 4 (four) times daily as needed for mouth pain. 10/03/22   Valinda Hoar, NP  medroxyPROGESTERone Acetate 150 MG/ML SUSY Inject into the muscle. 11/15/21 11/15/22  [provider]  Prenatal 27-1 MG TABS Take 1 tablet by mouth daily.    [provider]  levonorgestrel (MIRENA) 20 MCG/24HR IUD 1 each by Intrauterine route once.  03/09/19  [provider]    Family History Family History  Problem Relation Age of Onset   Healthy Mother    Healthy Father    Cancer Maternal Grandmother 65       LUNG   Breast cancer Paternal Grandmother 3  Social History Social History   Tobacco Use   Smoking status: Never   Smokeless tobacco: Never  Vaping Use   Vaping status: Never Used  Substance Use Topics   Alcohol use: Not Currently   Drug use: Never     Allergies   Patient has no known allergies.   Review of Systems Review of Systems   Physical Exam Triage Vital Signs ED Triage Vitals  Encounter Vitals Group     BP 01/03/23 1243 134/84     Systolic BP Percentile --      Diastolic BP Percentile --      Pulse Rate 01/03/23 1243 93     Resp 01/03/23 1243 16     Temp 01/03/23 1243 98.9 F (37.2 C)     Temp Source 01/03/23 1243 Oral     SpO2 01/03/23 1243 97 %     Weight --      Height --      Head Circumference --      Peak Flow --      Pain Score  01/03/23 1339 0     Pain Loc --      Pain Education --      Exclude from Growth Chart --    No data found.  Updated Vital Signs BP 134/84 (BP Location: Left Arm)   Pulse 93   Temp 98.9 F (37.2 C) (Oral)   Resp 16   SpO2 97%   Visual Acuity Right Eye Distance:   Left Eye Distance:   Bilateral Distance:    Right Eye Near:   Left Eye Near:    Bilateral Near:     Physical Exam Constitutional:      Appearance: Normal appearance. She is well-developed.  HENT:     Right Ear: Tympanic membrane and ear canal normal.     Left Ear: Tympanic membrane and ear canal normal.     Nose: Congestion present. No rhinorrhea.     Mouth/Throat:     Pharynx: Posterior oropharyngeal erythema present. No oropharyngeal exudate.  Eyes:     Extraocular Movements: Extraocular movements intact.  Cardiovascular:     Rate and Rhythm: Normal rate and regular rhythm.     Pulses: Normal pulses.     Heart sounds: Normal heart sounds.  Pulmonary:     Effort: Pulmonary effort is normal.     Breath sounds: Normal breath sounds.  Musculoskeletal:     Cervical back: Normal range of motion and neck supple.  Skin:    General: Skin is warm and dry.  Neurological:     General: No focal deficit present.     Mental Status: She is alert and oriented to person, place, and time.      UC Treatments / Results  Labs (all labs ordered are listed, but only abnormal results are displayed) Labs Reviewed  POCT RAPID STREP A (OFFICE)  POC COVID19/FLU A&B COMBO    EKG   Radiology No results found.  Procedures Procedures (including critical care time)  Medications Ordered in UC Medications - No data to display  Initial Impression / Assessment and Plan / UC Course  I have reviewed the triage vital signs and the nursing notes.  Pertinent labs & imaging results that were available during my care of the patient were reviewed by me and considered in my medical decision making (see chart for  details).  Viral URI   Patient is in no signs of distress nor toxic appearing.  Vital signs are  stable.  Low suspicion for pneumonia, pneumothorax or bronchitis and therefore will defer imaging.  COVID, flu, strep testing negative.May use additional over-the-counter medications as needed for supportive care.  May follow-up with urgent care as needed if symptoms persist or worsen.  Note given.   Final Clinical Impressions(s) / UC Diagnoses   Final diagnoses:  Viral URI     Discharge Instructions      Your symptoms today are most likely being caused by a virus and should steadily improve in time it can take up to 7 to 10 days before you truly start to see a turnaround however things will get better  COVID, flu and strep testing negative    You can take Tylenol and/or Ibuprofen as needed for fever reduction and pain relief.   For cough: honey 1/2 to 1 teaspoon (you can dilute the honey in water or another fluid).  You can also use guaifenesin and dextromethorphan for cough. You can use a humidifier for chest congestion and cough.  If you don't have a humidifier, you can sit in the bathroom with the hot shower running.      For sore throat: try warm salt water gargles, cepacol lozenges, throat spray, warm tea or water with lemon/honey, popsicles or ice, or OTC cold relief medicine for throat discomfort.   For congestion: take a daily anti-histamine like Zyrtec, Claritin, and a oral decongestant, such as pseudoephedrine.  You can also use Flonase 1-2 sprays in each nostril daily.   It is important to stay hydrated: drink plenty of fluids (water, gatorade/powerade/pedialyte, juices, or teas) to keep your throat moisturized and help further relieve irritation/discomfort.    ED Prescriptions   None    PDMP not reviewed this encounter.   Valinda Hoar, NP 01/03/23 1345

## 2023-01-26 ENCOUNTER — Ambulatory Visit
Admission: EM | Admit: 2023-01-26 | Discharge: 2023-01-26 | Disposition: A | Discharge status: Home / Self Care | Payer: Medicaid Other | Attending: Emergency Medicine | Admitting: Emergency Medicine

## 2023-01-26 ENCOUNTER — Other Ambulatory Visit: Payer: Self-pay

## 2023-01-26 ENCOUNTER — Encounter: Payer: Self-pay | Admitting: *Deleted

## 2023-01-26 DIAGNOSIS — M545 Low back pain, unspecified: Secondary | ICD-10-CM | POA: Diagnosis not present

## 2023-01-26 MED ORDER — PREDNISONE 20 MG PO TABS: 40.0000 mg | ORAL_TABLET | Freq: Every day | ORAL | 0 refills | Status: AC

## 2023-01-26 MED ORDER — CYCLOBENZAPRINE HCL 10 MG PO TABS: 10.0000 mg | ORAL_TABLET | Freq: Every day | ORAL | 0 refills | Status: AC

## 2023-01-26 MED ORDER — KETOROLAC TROMETHAMINE 30 MG/ML IJ SOLN
30.0000 mg | Freq: Once | INTRAMUSCULAR | Status: AC
  Administered 2023-01-26: 30 mg via INTRAMUSCULAR

## 2023-01-26 NOTE — ED Triage Notes (Addendum)
 Low back pain x 2 weeks. Last night she bent over and the pain became much worse. Took ibuprofen last night

## 2023-01-26 NOTE — ED Notes (Addendum)
 Toradol given by provider

## 2023-01-26 NOTE — ED Provider Notes (Signed)
 [facility]    CSN: [id number] Arrival date & time: 01/26/23  1150      History   Chief Complaint Chief Complaint  Patient presents with   Back Pain    HPI Cathy Ortiz is a 33 y.o. female.   Patient presents for evaluation of constant right-sided low back pain present for 2 weeks, exacerbated 1 day ago after bending causing symptoms to significantly worsen and limit mobility.  Denies direct injury.  Radiates to the right pelvis, exacerbated with long periods of sitting, walking or standing.  Denies numbness or tingling, urinary or bowel incontinence.  Has attempted use of Tylenol and small doses of ibuprofen.  History of gastritis, advised to avoid NSAIDs.   Past Medical History:  Diagnosis Date   BV (bacterial vaginosis)    Recurrent   History of chlamydia    Seasonal allergies     Patient Active Problem List   Diagnosis Date Noted   Cervical high risk HPV (human papillomavirus) test positive 06/01/2021   History of polyhydramnios 03/10/2021   Anemia 02/09/2021   Contraception management 01/14/2021   History of gestational hypertension 01/14/2021   Supervision of normal pregnancy 11/14/2019   Postpartum care and examination 11/14/2019   PMDD (premenstrual dysphoric disorder) 10/24/2018   Left shoulder pain 03/06/2018   Depressive disorder 08/08/2017   Prediabetes 08/08/2017   Herpes simplex 05/28/2017   Breast nodule 01/20/2014   Heart murmur on physical examination 01/20/2014   Migraines 01/20/2014    Past Surgical History:  Procedure Laterality Date   WISDOM TOOTH EXTRACTION  2016/2017   ALL FOUR    OB History     Gravida  3   Para  2   Term  2   Preterm  0   AB  0   Living  2      SAB  0   IAB  0   Ectopic  0   Multiple  0   Live Births  2            Home Medications    Prior to Admission medications   Medication Sig Start Date End Date Taking? Authorizing Provider  acetaminophen (TYLENOL) 500 MG tablet Take by  mouth. 05/03/21  Yes [provider]  cetirizine  (ZYRTEC ) 10 MG tablet Take 10 mg by mouth daily.   Yes [provider]  cholecalciferol (VITAMIN D3) 25 MCG (1000 UNIT) tablet Take 1 tablet (1,000 Units total) by mouth daily. 10/27/20  Yes Arloa Lamar SQUIBB, MD  cyclobenzaprine  (FLEXERIL ) 10 MG tablet Take 1 tablet (10 mg total) by mouth at bedtime. 01/26/23  Yes Clotile Whittington R, NP  escitalopram (LEXAPRO) 20 MG tablet Take 20 mg by mouth daily.   Yes [provider]  predniSONE  (DELTASONE ) 20 MG tablet Take 2 tablets (40 mg total) by mouth daily. 01/26/23  Yes Vrinda Heckstall R, NP  Prenatal 27-1 MG TABS Take 1 tablet by mouth daily.   Yes [provider]  Calcium Carb-Cholecalciferol 600-400 MG-UNIT TABS Take by mouth. Patient not taking: Reported on 01/26/2023    [provider]  escitalopram (LEXAPRO) 10 MG tablet Take 1 tablet by mouth daily. 05/03/21 08/12/22  [provider]  fexofenadine (ALLEGRA) 180 MG tablet 180 mg daily. Patient not taking: Reported on 01/26/2023 02/20/22 02/20/23  [provider]  magic mouthwash (nystatin , lidocaine, diphenhydrAMINE, alum & mag hydroxide) suspension Swish and spit 5 mLs 4 (four) times daily as needed for mouth pain. Patient not taking: Reported  on 01/26/2023 10/03/22   Teresa Shelba SAUNDERS, NP  medroxyPROGESTERone Acetate 150 MG/ML SUSY Inject into the muscle. 11/15/21 11/15/22  [provider]  levonorgestrel  (MIRENA ) 20 MCG/24HR IUD 1 each by Intrauterine route once.  03/09/19  [provider]    Family History Family History  Problem Relation Age of Onset   Healthy Mother    Healthy Father    Cancer Maternal Grandmother 32       LUNG   Breast cancer Paternal Grandmother 28    Social History Social History   Tobacco Use   Smoking status: Never   Smokeless tobacco: Never  Vaping Use   Vaping status: Never Used  Substance Use Topics   Alcohol use: Not Currently   Drug  use: Never     Allergies   Patient has no known allergies.   Review of Systems Review of Systems   Physical Exam Triage Vital Signs ED Triage Vitals  Encounter Vitals Group     BP 01/26/23 1243 111/76     Systolic BP Percentile --      Diastolic BP Percentile --      Pulse Rate 01/26/23 1243 92     Resp 01/26/23 1243 16     Temp 01/26/23 1243 98.1 F (36.7 C)     Temp Source 01/26/23 1243 Oral     SpO2 01/26/23 1243 98 %     Weight --      Height --      Head Circumference --      Peak Flow --      Pain Score 01/26/23 1245 6     Pain Loc --      Pain Education --      Exclude from Growth Chart --    No data found.  Updated Vital Signs BP 111/76 (BP Location: Right Arm)   Pulse 92   Temp 98.1 F (36.7 C) (Oral)   Resp 16   LMP  (LMP Unknown) Comment: States depo-shot was due in Nov but she didn't get it  SpO2 98%   Visual Acuity Right Eye Distance:   Left Eye Distance:   Bilateral Distance:    Right Eye Near:   Left Eye Near:    Bilateral Near:     Physical Exam Constitutional:      Appearance: Normal appearance.  Eyes:     Extraocular Movements: Extraocular movements intact.  Pulmonary:     Effort: Pulmonary effort is normal.  Musculoskeletal:     Comments: Tenderness extending from the mid to lower right side of the back, no ecchymosis swelling or deformity, able to sit erect without complication, positive right straight leg test, able to twist turn and bend but pain is elicited with movement  Neurological:     Mental Status: She is alert and oriented to person, place, and time. Mental status is at baseline.      UC Treatments / Results  Labs (all labs ordered are listed, but only abnormal results are displayed) Labs Reviewed - No data to display  EKG   Radiology No results found.  Procedures Procedures (including critical care time)  Medications Ordered in UC Medications  ketorolac  (TORADOL ) 30 MG/ML injection 30 mg (30 mg  Intramuscular Given by Other 01/26/23 1311)    Initial Impression / Assessment and Plan / UC Course  I have reviewed the triage vital signs and the nursing notes.  Pertinent labs & imaging results that were available during my care of the patient  were reviewed by me and considered in my medical decision making (see chart for details).  Acute right-sided low back pain without sciatica  Etiology muscular low suspicion for spinal involvement therefore imaging deferred, no spinal tenderness noted on exam or any signs of saddle anesthesia, stable for outpatient management, Toradol  IM given, prescribed prednisone  and muscle relaxant for home use recommended RICE, heat massage stretching with activity as tolerated, walking referral given to orthopedics if no improvement seen , work note given Final Clinical Impressions(s) / UC Diagnoses   Final diagnoses:  Acute right-sided low back pain without sciatica     Discharge Instructions      Your pain is most likely caused by irritation to the muscles.  Been given an injection of Toradol  to help reduce inflammation and pain and ideally will see improvement within the hour  Starting tomorrow take prednisone  every morning with food for 5 days, may continue Tylenol throughout use or any topical medications  May use muscle relaxant at bedtime for additional comfort to allow you to rest  You may use heating pad in 15 minute intervals as needed for additional comfort, or  you may find comfort in using ice in 10-15 minutes over affected area  Begin massaging or stretching affected area daily for 10 minutes as tolerated to further loosen muscles   When sitting or lying down place pillow underneath and between knees for support  Practice good posture: head back, shoulders back, chest forward, pelvis back and weight distributed evenly on both legs  If pain persist after recommended treatment or reoccurs if may be beneficial to follow up with orthopedic  specialist for evaluation, this doctor specializes in the bones and can manage your symptoms long-term with options such as but not limited to imaging, medications or physical therapy      ED Prescriptions     Medication Sig Dispense Auth. Provider   predniSONE  (DELTASONE ) 20 MG tablet Take 2 tablets (40 mg total) by mouth daily. 10 tablet Lakishia Bourassa R, NP   cyclobenzaprine  (FLEXERIL ) 10 MG tablet Take 1 tablet (10 mg total) by mouth at bedtime. 10 tablet Izyk Marty R, NP      PDMP not reviewed this encounter.   Teresa Shelba SAUNDERS, NP 01/26/23 1356

## 2023-01-26 NOTE — Discharge Instructions (Addendum)
 Your pain is most likely caused by irritation to the muscles.  Been given an injection of Toradol  to help reduce inflammation and pain and ideally will see improvement within the hour  Starting tomorrow take prednisone  every morning with food for 5 days, may continue Tylenol throughout use or any topical medications  May use muscle relaxant at bedtime for additional comfort to allow you to rest  You may use heating pad in 15 minute intervals as needed for additional comfort, or  you may find comfort in using ice in 10-15 minutes over affected area  Begin massaging or stretching affected area daily for 10 minutes as tolerated to further loosen muscles   When sitting or lying down place pillow underneath and between knees for support  Practice good posture: head back, shoulders back, chest forward, pelvis back and weight distributed evenly on both legs  If pain persist after recommended treatment or reoccurs if may be beneficial to follow up with orthopedic specialist for evaluation, this doctor specializes in the bones and can manage your symptoms long-term with options such as but not limited to imaging, medications or physical therapy

## 2023-02-25 ENCOUNTER — Other Ambulatory Visit: Payer: Self-pay

## 2023-02-25 ENCOUNTER — Emergency Department
Admission: EM | Admit: 2023-02-25 | Discharge: 2023-02-25 | Disposition: A | Discharge status: Home / Self Care | Payer: Medicaid Other | Attending: Emergency Medicine | Admitting: Emergency Medicine

## 2023-02-25 ENCOUNTER — Emergency Department: Payer: Medicaid Other

## 2023-02-25 DIAGNOSIS — D72829 Elevated white blood cell count, unspecified: Secondary | ICD-10-CM | POA: Insufficient documentation

## 2023-02-25 DIAGNOSIS — R1084 Generalized abdominal pain: Secondary | ICD-10-CM | POA: Diagnosis present

## 2023-02-25 DIAGNOSIS — R110 Nausea: Secondary | ICD-10-CM | POA: Diagnosis not present

## 2023-02-25 LAB — COMPREHENSIVE METABOLIC PANEL
ALT: 20 U/L (ref 0–44)
AST: 17 U/L (ref 15–41)
Albumin: 4.3 g/dL (ref 3.5–5.0)
Alkaline Phosphatase: 71 U/L (ref 38–126)
Anion gap: 11 (ref 5–15)
BUN: 16 mg/dL (ref 6–20)
CO2: 21 mmol/L — ABNORMAL LOW (ref 22–32)
Calcium: 9.5 mg/dL (ref 8.9–10.3)
Chloride: 103 mmol/L (ref 98–111)
Creatinine, Ser: 0.81 mg/dL (ref 0.44–1.00)
GFR, Estimated: >60 mL/min (ref >60)
Glucose, Bld: 108 mg/dL — ABNORMAL HIGH (ref 70–99)
Potassium: 3.7 mmol/L (ref 3.5–5.1)
Sodium: 135 mmol/L (ref 135–145)
Total Bilirubin: 0.4 mg/dL (ref 0.0–1.2)
Total Protein: 8.1 g/dL (ref 6.5–8.1)

## 2023-02-25 LAB — CBC
HCT: 39.9 % (ref 36.0–46.0)
Hemoglobin: 13.1 g/dL (ref 12.0–15.0)
MCH: 28 pg (ref 26.0–34.0)
MCHC: 32.8 g/dL (ref 30.0–36.0)
MCV: 85.3 fL (ref 80.0–100.0)
Platelets: 383 10*3/uL (ref 150–400)
RBC: 4.68 MIL/uL (ref 3.87–5.11)
RDW: 13.9 % (ref 11.5–15.5)
WBC: 13.2 10*3/uL — ABNORMAL HIGH (ref 4.0–10.5)
nRBC: 0 % (ref 0.0–0.2)

## 2023-02-25 LAB — URINALYSIS, ROUTINE W REFLEX MICROSCOPIC
Bilirubin Urine: NEGATIVE
Glucose, UA: NEGATIVE mg/dL
Hgb urine dipstick: NEGATIVE
Ketones, ur: NEGATIVE mg/dL
Leukocytes,Ua: NEGATIVE
Nitrite: NEGATIVE
Protein, ur: NEGATIVE mg/dL
Specific Gravity, Urine: 1.019 (ref 1.005–1.030)
pH: 5 (ref 5.0–8.0)

## 2023-02-25 LAB — LIPASE, BLOOD: Lipase: 33 U/L (ref 11–51)

## 2023-02-25 LAB — POC URINE PREG, ED: Preg Test, Ur: NEGATIVE

## 2023-02-25 MED ORDER — PANTOPRAZOLE SODIUM 40 MG PO TBEC: 40.0000 mg | DELAYED_RELEASE_TABLET | ORAL | 1 refills | Status: AC

## 2023-02-25 MED ORDER — SUCRALFATE 1 G PO TABS: 1.0000 g | ORAL_TABLET | Freq: Four times a day (QID) | ORAL | 0 refills | Status: DC

## 2023-02-25 MED ORDER — PANTOPRAZOLE SODIUM 40 MG PO TBEC: 40.0000 mg | DELAYED_RELEASE_TABLET | ORAL | 1 refills | Status: DC

## 2023-02-25 MED ORDER — ONDANSETRON 4 MG PO TBDP: 4.0000 mg | ORAL_TABLET | Freq: Three times a day (TID) | ORAL | 0 refills | Status: DC | PRN

## 2023-02-25 MED ORDER — ONDANSETRON 4 MG PO TBDP
4.0000 mg | ORAL_TABLET | Freq: Once | ORAL | Status: AC
  Administered 2023-02-25: 4 mg via ORAL
  Filled 2023-02-25: qty 1

## 2023-02-25 MED ORDER — ONDANSETRON 4 MG PO TBDP: 4.0000 mg | ORAL_TABLET | Freq: Three times a day (TID) | ORAL | 0 refills | Status: AC | PRN

## 2023-02-25 MED ORDER — SUCRALFATE 1 G PO TABS: 1.0000 g | ORAL_TABLET | Freq: Four times a day (QID) | ORAL | 0 refills | Status: AC

## 2023-02-25 MED ORDER — IOHEXOL 300 MG/ML  SOLN
100.0000 mL | Freq: Once | INTRAMUSCULAR | Status: AC | PRN
  Administered 2023-02-25: 100 mL via INTRAVENOUS

## 2023-02-25 NOTE — ED Triage Notes (Signed)
Patient C/O intermittent upper-middle abdominal pain that began on Tuesday after eating. Patient was seen at urgent care for same, and they gave her prilosec. She states the prilosec does give some relief. Patient has history of stomach ulcer.

## 2023-02-25 NOTE — Discharge Instructions (Signed)
You were evaluated in the ED for abdominal pain.  Your lab work is reassuring.  Your urinalysis is normal.  Your CT abdominal and pelvis is normal.  Please follow-up with your primary care as needed.  If symptoms do not improve in a week's time please consider following up with GI.

## 2023-02-25 NOTE — ED Provider Notes (Signed)
[facility] Emergency Department Provider Note     Event Date/Time   First MD Initiated Contact with Patient 02/25/23 2024     (approximate)   History   Abdominal Pain   HPI  Cathy Ortiz is a 33 y.o. female with no significant past medical history who presents to the ED with complaint of generalized abdominal pain x 5 days. States onset occurred after eating. Was seen at [facility] who diagnosed with gastritis and prescribed prilosec. Patient reports symptoms have returned and are now more severe. Endorses nausea. Denies fever, vomiting and urinary symptoms.       Physical Exam   Triage Vital Signs: ED Triage Vitals  Encounter Vitals Group     BP 02/25/23 1926 (!) 131/91     Systolic BP Percentile --      Diastolic BP Percentile --      Pulse Rate 02/25/23 1926 95     Resp 02/25/23 1926 18     Temp 02/25/23 1926 98.8 F (37.1 C)     Temp Source 02/25/23 1926 Oral     SpO2 02/25/23 1926 96 %     Weight 02/25/23 1927 180 lb (81.6 kg)     Height 02/25/23 1927 5\' 1"  (1.549 m)     Head Circumference --      Peak Flow --      Pain Score --      Pain Loc --      Pain Education --      Exclude from Growth Chart --     Most recent vital signs: Vitals:   02/25/23 1926 02/25/23 2305  BP: (!) 131/91 126/82  Pulse: 95 76  Resp: 18 17  Temp: 98.8 F (37.1 C) 98 F (36.7 C)  SpO2: 96% 99%    General Awake, no distress.  HEENT NCAT. PERRL. EOMI. No rhinorrhea. Mucous membranes are moist.  CV:  Good peripheral perfusion.  RESP:  Normal effort.  ABD:  No distention. NBS x 4.  Soft and nontender.  No masses or organomegaly upon palpation.  No CVA tenderness.   ED Results / Procedures / Treatments   Labs (all labs ordered are listed, but only abnormal results are displayed) Labs Reviewed  COMPREHENSIVE METABOLIC PANEL - Abnormal; Notable for the following components:      Result Value   CO2 21 (*)    Glucose, Bld 108 (*)    All other  components within normal limits  CBC - Abnormal; Notable for the following components:   WBC 13.2 (*)    All other components within normal limits  URINALYSIS, ROUTINE W REFLEX MICROSCOPIC - Abnormal; Notable for the following components:   Color, Urine YELLOW (*)    APPearance CLEAR (*)    All other components within normal limits  LIPASE, BLOOD  POC URINE PREG, ED   RADIOLOGY  I personally viewed and evaluated these images as part of my medical decision making, as well as reviewing the written report by the radiologist.  ED Provider Interpretation: CT abdomen and pelvis appears normal.  CT ABDOMEN PELVIS W CONTRAST Result Date: 02/25/2023 CLINICAL DATA:  Upper abdominal pain EXAM: CT ABDOMEN AND PELVIS WITH CONTRAST TECHNIQUE: Multidetector CT imaging of the abdomen and pelvis was performed using the standard protocol following bolus administration of intravenous contrast. RADIATION DOSE REDUCTION: This exam was performed according to the departmental dose-optimization program which includes automated exposure control, adjustment of the mA and/or kV according to patient size and/or  use of iterative reconstruction technique. CONTRAST:  OMNIPAQUE IOHEXOL 300 MG/ML  SOLN COMPARISON:  None Available. FINDINGS: Lower Chest: Normal. Hepatobiliary: Normal hepatic contours. No intra- or extrahepatic biliary dilatation. The gallbladder is normal. Pancreas: Normal pancreas. No ductal dilatation or peripancreatic fluid collection. Spleen: Normal. Adrenals/Urinary Tract: The adrenal glands are normal. No hydronephrosis, nephroureterolithiasis or solid renal mass. The urinary bladder is normal for degree of distention Stomach/Bowel: There is no hiatal hernia. Normal duodenal course and caliber. No small bowel dilatation or inflammation. No focal colonic abnormality. Normal appendix. Vascular/Lymphatic: Normal course and caliber of the major abdominal vessels. No abdominal or pelvic lymphadenopathy.  Reproductive: Normal uterus. No adnexal mass. Other: None. Musculoskeletal: No bony spinal canal stenosis or focal osseous abnormality. IMPRESSION: No acute abnormality of the abdomen or pelvis. Electronically Signed   By: Deatra Robinson M.D.   On: 02/25/2023 22:02    PROCEDURES:  Critical Care performed: No  Procedures  MEDICATIONS ORDERED IN ED: Medications  iohexol (OMNIPAQUE) 300 MG/ML solution 100 mL (100 mLs Intravenous Contrast Given 02/25/23 2147)  ondansetron (ZOFRAN-ODT) disintegrating tablet 4 mg (4 mg Oral Given 02/25/23 2300)    IMPRESSION / MDM / ASSESSMENT AND PLAN / ED COURSE  I reviewed the triage vital signs and the nursing notes.                              Clinical Course as of 02/26/23 0003  Wynelle Link Feb 25, 2023  2229 Prilosec helps  [MH]    Clinical Course User Index [MH] Romeo Apple, [place] A, PA-C    33 y.o. female presents to the emergency department for evaluation and treatment of abdominal pain. See HPI for further details.   Differential diagnosis includes, but is not limited to acute appendicitis, diverticulitis, urinary tract infection/pyelonephritis,  gastroenteritis  Patient's presentation is most consistent with acute complicated illness / injury requiring diagnostic workup.  Patient is alert and oriented.  She is hemodynamically stable and afebrile.  Lab work is reassuring.  There is a mildly elevated WBC of 13.2.  Lipase is normal.  Urinalysis is normal.  CT abdominal pelvis is reassuring.  Patient reports she has a appointment scheduled for tomorrow for a follow-up of her urgent care visit.  This is reassuring.  Patient is in stable condition for discharge home at this time.  ED return precautions are discussed.   FINAL CLINICAL IMPRESSION(S) / ED DIAGNOSES   Final diagnoses:  Generalized abdominal pain    Rx / DC Orders   ED Discharge Orders          Ordered    ondansetron (ZOFRAN-ODT) 4 MG disintegrating tablet  Every 8 hours PRN,   Status:   Discontinued        02/25/23 2252    sucralfate (CARAFATE) 1 g tablet  4 times daily,   Status:  Discontinued        02/25/23 2252    pantoprazole (PROTONIX) 40 MG tablet  BH-each morning,   Status:  Discontinued        02/25/23 2252    ondansetron (ZOFRAN-ODT) 4 MG disintegrating tablet  Every 8 hours PRN        02/25/23 2304    pantoprazole (PROTONIX) 40 MG tablet  BH-each morning        02/25/23 2304    sucralfate (CARAFATE) 1 g tablet  4 times daily        02/25/23 2304  Note:  This document was prepared using Dragon voice recognition software and may include unintentional dictation errors.    Romeo Apple, Lancer Thurner A, PA-C 02/26/23 Ivor Reining    Willy Eddy, MD 02/26/23 [id number]

## 2023-04-01 ENCOUNTER — Ambulatory Visit
Admission: EM | Admit: 2023-04-01 | Discharge: 2023-04-01 | Disposition: A | Discharge status: Home / Self Care | Attending: Family Medicine | Admitting: Family Medicine

## 2023-04-01 DIAGNOSIS — J029 Acute pharyngitis, unspecified: Secondary | ICD-10-CM | POA: Insufficient documentation

## 2023-04-01 LAB — GROUP A STREP BY PCR: Group A Strep by PCR: NOT DETECTED
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
## 2023-05-24 ENCOUNTER — Ambulatory Visit
Admission: EM | Admit: 2023-05-24 | Discharge: 2023-05-24 | Disposition: A | Discharge status: Home / Self Care | Attending: Family Medicine | Admitting: Family Medicine

## 2023-05-24 DIAGNOSIS — N760 Acute vaginitis: Secondary | ICD-10-CM | POA: Diagnosis present

## 2023-05-24 MED ORDER — METRONIDAZOLE 500 MG PO TABS: 500.0000 mg | ORAL_TABLET | Freq: Two times a day (BID) | ORAL | 0 refills | Status: DC

## 2023-05-24 MED ORDER — METRONIDAZOLE 500 MG PO TABS: 500.0000 mg | ORAL_TABLET | Freq: Two times a day (BID) | ORAL | 0 refills | Status: AC

## 2023-05-24 NOTE — ED Provider Notes (Signed)
 [facility]    CSN: [id number] Arrival date & time: 05/24/23  1248      History   Chief Complaint Chief Complaint  Patient presents with  . Vaginal Discharge    HPI Cathy Ortiz is a 33 y.o. female.  Patient here for evaluation of vaginal discharge. Hx of recurrent bacterial vaginosis. Endorses odor. No itching. Denies concerns for STD. Past Medical History:  Diagnosis Date  . BV (bacterial vaginosis)    Recurrent  . History of chlamydia   . Seasonal allergies     Patient Active Problem List   Diagnosis Date Noted  . Cervical high risk HPV (human papillomavirus) test positive 06/01/2021  . History of polyhydramnios 03/10/2021  . Anemia 02/09/2021  . Contraception management 01/14/2021  . History of gestational hypertension 01/14/2021  . Supervision of normal pregnancy 11/14/2019  . Postpartum care and examination 11/14/2019  . PMDD (premenstrual dysphoric disorder) 10/24/2018  . Left shoulder pain 03/06/2018  . Depressive disorder 08/08/2017  . Prediabetes 08/08/2017  . Herpes simplex 05/28/2017  . Breast nodule 01/20/2014  . Heart murmur on physical examination 01/20/2014  . Migraines 01/20/2014    Past Surgical History:  Procedure Laterality Date  . WISDOM TOOTH EXTRACTION  2016/2017   ALL FOUR    OB History     Gravida  3   Para  2   Term  2   Preterm  0   AB  0   Living  2      SAB  0   IAB  0   Ectopic  0   Multiple  0   Live Births  2            Home Medications    Prior to Admission medications   Medication Sig Start Date End Date Taking? Authorizing Provider  acetaminophen (TYLENOL) 500 MG tablet Take by mouth. 05/03/21  Yes [provider]  Calcium Carb-Cholecalciferol 600-400 MG-UNIT TABS Take by mouth.   Yes [provider]  cetirizine  (ZYRTEC ) 10 MG tablet Take 10 mg by mouth daily.   Yes [provider]  escitalopram (LEXAPRO) 20 MG tablet Take 20 mg by mouth daily.   Yes  [provider]  metroNIDAZOLE  (FLAGYL ) 500 MG tablet Take 1 tablet (500 mg total) by mouth 2 (two) times daily. 05/24/23  Yes Buena Carmine, NP  ondansetron  (ZOFRAN -ODT) 4 MG disintegrating tablet Take 1 tablet (4 mg total) by mouth every 8 (eight) hours as needed. 02/25/23  Yes Phyllis Breeze, Myah A, PA-C  cholecalciferol (VITAMIN D3) 25 MCG (1000 UNIT) tablet Take 1 tablet (1,000 Units total) by mouth daily. 10/27/20   Alben Alma, MD  cyclobenzaprine  (FLEXERIL ) 10 MG tablet Take 1 tablet (10 mg total) by mouth at bedtime. 01/26/23   White, Maybelle Spatz, NP  escitalopram (LEXAPRO) 10 MG tablet Take 1 tablet by mouth daily. 05/03/21 08/12/22  [provider]  fexofenadine (ALLEGRA) 180 MG tablet 180 mg daily. Patient not taking: Reported on 01/26/2023 02/20/22 02/20/23  [provider]  magic mouthwash (nystatin , lidocaine, diphenhydrAMINE, alum & mag hydroxide) suspension Swish and spit 5 mLs 4 (four) times daily as needed for mouth pain. Patient not taking: Reported on 01/26/2023 10/03/22   White, Adrienne R, NP  medroxyPROGESTERone Acetate 150 MG/ML SUSY Inject into the muscle. 11/15/21 11/15/22  [provider]  pantoprazole  (PROTONIX ) 40 MG tablet Take 1 tablet (40 mg total) by mouth every morning. 02/25/23 04/26/23  Phyllis Breeze, Myah A, PA-C  predniSONE  (  DELTASONE ) 20 MG tablet Take 2 tablets (40 mg total) by mouth daily. 01/26/23   White, Maybelle Spatz, NP  Prenatal 27-1 MG TABS Take 1 tablet by mouth daily.    [provider]  sucralfate  (CARAFATE ) 1 g tablet Take 1 tablet (1 g total) by mouth 4 (four) times daily for 15 days. 02/25/23 03/12/23  Phyllis Breeze, Myah A, PA-C  levonorgestrel  (MIRENA ) 20 MCG/24HR IUD 1 each by Intrauterine route once.  03/09/19  [provider]    Family History Family History  Problem Relation Age of Onset  . Healthy Mother   . Healthy Father   . Cancer Maternal Grandmother 11       LUNG  . Breast cancer Paternal Grandmother 38     Social History Social History   Tobacco Use  . Smoking status: Never  . Smokeless tobacco: Never  Vaping Use  . Vaping status: Never Used  Substance Use Topics  . Alcohol use: Not Currently  . Drug use: Never     Allergies   Patient has no known allergies.   Review of Systems Review of Systems  Genitourinary:  Positive for vaginal discharge.     Physical Exam Triage Vital Signs ED Triage Vitals  Encounter Vitals Group     BP 05/24/23 1255 119/74     Systolic BP Percentile --      Diastolic BP Percentile --      Pulse Rate 05/24/23 1255 79     Resp 05/24/23 1255 16     Temp 05/24/23 1255 98.9 F (37.2 C)     Temp Source 05/24/23 1255 Oral     SpO2 05/24/23 1255 98 %     Weight 05/24/23 1252 163 lb (73.9 kg)     Height 05/24/23 1252 5\' 2"  (1.575 m)     Head Circumference --      Peak Flow --      Pain Score 05/24/23 1303 0     Pain Loc --      Pain Education --      Exclude from Growth Chart --    No data found.  Updated Vital Signs BP 119/74 (BP Location: Left Arm)   Pulse 79   Temp 98.9 F (37.2 C) (Oral)   Resp 16   Ht 5\' 2"  (1.575 m)   Wt 163 lb (73.9 kg)   SpO2 98%   BMI 29.81 kg/m   Visual Acuity Right Eye Distance:   Left Eye Distance:   Bilateral Distance:    Right Eye Near:   Left Eye Near:    Bilateral Near:     Physical Exam General appearance: Alert, well developed, well nourished, cooperative  Head: Normocephalic, without obvious abnormality, atraumatic Respiratory: Respirations even and unlabored, normal respiratory rate Heart: rate and rhythm normal.   CVA:  no flank pain Extremities: No gross deformities Skin: Skin color, texture, turgor normal. No rashes seen  Psych: Appropriate mood and affect.   UC Treatments / Results  Labs (all labs ordered are listed, but only abnormal results are displayed) Labs Reviewed  CERVICOVAGINAL ANCILLARY ONLY    EKG   Radiology No results found.  Procedures Procedures  (including critical care time)  Medications Ordered in UC Medications - No data to display  Initial Impression / Assessment and Plan / UC Course  I have reviewed the triage vital signs and the nursing notes.  Pertinent labs & imaging results that were available during my care of the patient were reviewed by  me and considered in my medical decision making (see chart for details).     *** Final Clinical Impressions(s) / UC Diagnoses   Final diagnoses:  Vaginitis and vulvovaginitis   Discharge Instructions   None    ED Prescriptions     Medication Sig Dispense Auth. Provider   metroNIDAZOLE  (FLAGYL ) 500 MG tablet Take 1 tablet (500 mg total) by mouth 2 (two) times daily. 14 tablet Buena Carmine, NP   metroNIDAZOLE  (FLAGYL ) 500 MG tablet  (Status: Discontinued) Take 1 tablet (500 mg total) by mouth 2 (two) times daily with a meal. DO NOT CONSUME ALCOHOL WHILE TAKING THIS MEDICATION. 14 tablet Buena Carmine, NP      PDMP not reviewed this encounter.

## 2023-05-24 NOTE — ED Triage Notes (Signed)
 Pt c/o vaginal discharge & odor x2 days. Denies any concerns for STD.

## 2023-05-25 LAB — CERVICOVAGINAL ANCILLARY ONLY
Bacterial Vaginitis (gardnerella): POSITIVE — AB
Candida Glabrata: NEGATIVE
Candida Vaginitis: NEGATIVE
Chlamydia: NEGATIVE
Comment: NEGATIVE
Comment: NEGATIVE
Comment: NEGATIVE
Comment: NEGATIVE
Comment: NEGATIVE
Comment: NORMAL
Neisseria Gonorrhea: NEGATIVE
Trichomonas: NEGATIVE

## 2024-01-23 NOTE — Progress Notes (Signed)
 "  Referring Provider:  Damien Ryder PA  HPI:  Cathy Ortiz is a 33 y.o.  [id number]  who presents today for evaluation and management of abnormal cervical cytology.    Prior pap smears:  11/23/2023:  ASCUS HPV 16/18 + 08/31/2021:  NILM HPV 16/18+  Prior cervical / vaginal findings: Colposcopy 04/18/2022: ECC benign (no cervical biopsy taken)  Prior cervical treatment(s): N/A  Symptoms/History:  -Abnormal vaginal discharge: denies -Postmenopausal: no -Intermenstrual bleeding: denies -Postcoital bleeding: denies -Bleeding problems (non-gyn): denies -Contraception: condoms -Number of current sexual partners: 1 -Number of partners in lifetime: 6 -History of a high risk partner: denies -History of STDs: HSV -Smoking: denies -Gardasil Vaccine: unsure      ROS:  Pertinent items are noted in HPI.  OB History  Gravida Para Term Preterm AB Living  3 2 2  0 0 2  SAB IAB Ectopic Multiple Live Births  0 0 0 0 2    # Outcome Date GA Lbr Len/2nd Weight Sex Type Anes PTL Lv  3 Gravida           2 Term 02/08/16 [redacted]w[redacted]d  6 lb 15 oz (3.147 kg) M Vag-Spont   LIV  1 Term 06/13/14 [redacted]w[redacted]d  5 lb 15 oz (2.693 kg) F Vag-Spont   LIV     Complications: Preeclampsia    Past Medical History:  Diagnosis Date   BV (bacterial vaginosis)    Recurrent   History of chlamydia    Seasonal allergies     Past Surgical History:  Procedure Laterality Date   WISDOM TOOTH EXTRACTION  2016/2017   ALL FOUR    SOCIAL HISTORY:  Social History   Substance and Sexual Activity  Alcohol Use Not Currently    Social History   Substance and Sexual Activity  Drug Use Never     Family History  Problem Relation Age of Onset   Healthy Mother    Healthy Father    Cancer Maternal Grandmother 45       LUNG   Breast cancer Paternal Grandmother 50    ALLERGIES:  Patient has no known allergies.  She has a current medication list which includes the following prescription(s): acetaminophen, calcium  carb-cholecalciferol, cetirizine , cholecalciferol, escitalopram, prenatal, cyclobenzaprine , escitalopram, fexofenadine, magic mouthwash (nystatin , lidocaine, diphenhydrAMINE, alum & mag hydroxide) suspension, medroxyprogesterone acetate, metronidazole , ondansetron , pantoprazole , prednisone , sucralfate , and [DISCONTINUED] levonorgestrel .  Physical Exam: -Vitals:  BP 119/72   Pulse 71   Wt 170 lb 4.8 oz (77.2 kg)   BMI 31.15 kg/m   PROCEDURE: Colposcopy performed with 4% acetic acid and Lugol's after informed consent obtained. Urine pregnancy test negative.  Physical Exam Vitals and nursing note reviewed. Exam conducted with a chaperone present.  Constitutional:      Appearance: Normal appearance.  HENT:     Head: Normocephalic and atraumatic.  Eyes:     Extraocular Movements: Extraocular movements intact.  Pulmonary:     Effort: Pulmonary effort is normal.  Genitourinary:    General: Normal vulva.     Vagina: Normal.     Cervix: Normal and dilated. No cervical motion tenderness, friability or erythema.        Comments: RED = acetowhite lesions BLUE = biopsies  Musculoskeletal:        General: Normal range of motion.  Neurological:     General: No focal deficit present.     Mental Status: She is alert.  Psychiatric:        Mood and Affect: Mood normal.                               -  Aceto-white Lesions Location(s): See above              -Biopsy performed at 5, 11 o'clock               -ECC indicated and performed: Yes.       -Biopsy sites made hemostatic with pressure and Monsel's solution   -Satisfactory colposcopy: Yes.      -Evidence of Invasive cervical CA :  NO  ASSESSMENT:  Cathy Ortiz is a 33 y.o. [id number] with ASCUS and HPV-HR positive, 16/18 POS on recent pap (11/23/23), here for colposcopy today, performed as above without complications.  -ECC and 2 cervical bx sent to pathology -Aftercare instructions for home reviewed, si/sx of when to call/return  discussed. -Gardasil counseling done, #1 given today. -Discussed possible outcomes based on pathology; will call with results   Estil Mangle, DO [facility] "

## 2024-01-30 ENCOUNTER — Ambulatory Visit (INDEPENDENT_AMBULATORY_CARE_PROVIDER_SITE_OTHER): Admitting: Obstetrics

## 2024-01-30 ENCOUNTER — Other Ambulatory Visit (HOSPITAL_COMMUNITY)
Admission: RE | Admit: 2024-01-30 | Discharge: 2024-01-30 | Disposition: A | Source: Ambulatory Visit | Attending: Obstetrics | Admitting: Obstetrics

## 2024-01-30 ENCOUNTER — Encounter: Payer: Self-pay | Admitting: Obstetrics

## 2024-01-30 VITALS — BP 119/72 | HR 71 | Wt 170.3 lb

## 2024-01-30 DIAGNOSIS — Z3202 Encounter for pregnancy test, result negative: Secondary | ICD-10-CM | POA: Diagnosis not present

## 2024-01-30 DIAGNOSIS — R87622 Low grade squamous intraepithelial lesion on cytologic smear of vagina (LGSIL): Secondary | ICD-10-CM | POA: Diagnosis present

## 2024-01-30 DIAGNOSIS — R87810 Cervical high risk human papillomavirus (HPV) DNA test positive: Secondary | ICD-10-CM | POA: Insufficient documentation

## 2024-01-30 DIAGNOSIS — Z23 Encounter for immunization: Secondary | ICD-10-CM | POA: Diagnosis not present

## 2024-01-30 DIAGNOSIS — N870 Mild cervical dysplasia: Secondary | ICD-10-CM | POA: Diagnosis not present

## 2024-01-30 DIAGNOSIS — R8789 Other abnormal findings in specimens from female genital organs: Secondary | ICD-10-CM | POA: Diagnosis present

## 2024-01-30 DIAGNOSIS — Z01812 Encounter for preprocedural laboratory examination: Secondary | ICD-10-CM

## 2024-01-30 LAB — POCT URINE PREGNANCY: Preg Test, Ur: NEGATIVE

## 2024-01-30 NOTE — Patient Instructions (Addendum)
 Human Papillomavirus (HPV) Vaccine Injection What is this medication? HUMAN PAPILLOMAVIRUS VACCINE (HYOO muhn pap uh LOH muh vahy ruhs vak SEEN) reduces the risk of human papillomavirus (HPV). It does not treat HPV. It is still possible to get HPV after receiving this vaccine, but the symptoms may be less severe or not last as long. It works by helping your immune system learn how to fight off a future infection. This medicine may be used for other purposes; ask your health care provider or pharmacist if you have questions. COMMON BRAND NAME(S): Gardasil 9 What should I tell my care team before I take this medication? They need to know if you have any of these conditions: Fever Hemophilia HIV or AIDS Immune system problems Infection Low platelets An unusual reaction to human papillomavirus vaccine, yeast, other vaccines, other medications, foods, dyes, or preservatives Pregnant or trying to get pregnant Breastfeeding How should I use this medication? This vaccine is injected into a muscle. It is given by your care team. This vaccine requires 2 or 3 doses to get the full benefit. Set a reminder for when your next dose is due. A copy of the Vaccine Information Statement will be given before each vaccination. Be sure to read this information carefully each time. This sheet may change often. Talk to your care team about the use of this medication in children. While it may be prescribed for children as young as 9 years for selected conditions, precautions do apply. Overdosage: If you think you have taken too much of this medicine contact a poison control center or emergency room at once. NOTE: This medicine is only for you. Do not share this medicine with others. What if I miss a dose? Keep appointments for follow-up doses as directed. It is important not to miss your dose. Call your care team if you are unable to keep an appointment. What may interact with this medication? Certain medications  for arthritis Medications for organ transplant Medications to treat cancer Steroid medications, such as prednisone  or cortisone This list may not describe all possible interactions. Give your health care provider a list of all the medicines, herbs, non-prescription drugs, or dietary supplements you use. Also tell them if you smoke, drink alcohol, or use illegal drugs. Some items may interact with your medicine. What should I watch for while using this medication? Visit your care team regularly. Report any side effects to your care team right away. This vaccine, like all vaccines, may not fully protect everyone. What side effects may I notice from receiving this medication? Side effects that you should report to your care team as soon as possible: Allergic reactions--skin rash, itching, hives, swelling of the face, lips, tongue, or throat Feeling faint or lightheaded Side effects that usually do not require medical attention (report these to your care team if they continue or are bothersome): Diarrhea Dizziness Fatigue Fever Headache Nausea Pain, redness, irritation, or bruising at the injection site This list may not describe all possible side effects. Call your doctor for medical advice about side effects. You may report side effects to FDA at 1-800-FDA-1088. Where should I keep my medication? This vaccine is only given by your care team. It will not be stored at home. NOTE: This sheet is a summary. It may not cover all possible information. If you have questions about this medicine, talk to your doctor, pharmacist, or health care provider.  2024 Elsevier/Gold Standard (2021-06-22 00:00:00) Colposcopy, Care After  The following information offers guidance on how  to care for yourself after your procedure. Your doctor may also give you more specific instructions. If you have problems or questions, contact your doctor. What can I expect after the procedure? If you did not have a sample  of your tissue taken out (did not have a biopsy), you may only have some spotting of blood for a few days. You can go back to your normal activities. If you had a sample of your tissue taken out, it is common to have: Soreness and mild pain. These may last for a few days. Mild bleeding or fluid (discharge) coming from your vagina. The fluid will look dark and grainy. You may have this for a few days. The fluid may be caused by a liquid that was used during your procedure. You may need to wear a sanitary pad. Spotting of blood for at least 48 hours after the procedure. Follow these instructions at home: Medicines Take over-the-counter and prescription medicines only as told by your doctor. Ask your doctor what over-the-counter pain medicines and prescription medicines you can start taking again. This is very important if you take blood thinners. Activity For at least 3 days, or for as long as told by your doctor, avoid: Douching. Using tampons. Having sex. Return to your normal activities as told by your doctor. Ask your doctor what activities are safe for you. General instructions Ask your doctor if you may take baths, swim, or use a hot tub. You may take showers. If you use birth control (contraception), keep using it. Keep all follow-up visits. Contact a doctor if: You have a fever or chills. You faint or feel light-headed. Get help right away if: You bleed a lot from your vagina. A lot of bleeding means that the bleeding soaks through a pad in less than 1 hour. You have clumps of blood (blood clots) coming from your vagina. You have signs that could mean you have an infection. This may be fluid coming from your vagina that is: Different than normal. Yellow. Bad-smelling. You have very bad pain or cramps in your lower belly that do not get better with medicine. Summary If you did not have a sample of your tissue taken out, you may only have some spotting of blood for a few days. You  can go back to your normal activities. If you had a sample of your tissue taken out, it is common to have mild pain for a few days and spotting for 48 hours. Avoid douching, using tampons, and having sex for at least 3 days after the procedure or for as long as told. Get help right away if you have a lot of bleeding, very bad pain, or signs of infection. This information is not intended to replace advice given to you by your health care provider. Make sure you discuss any questions you have with your health care provider. Document Revised: 06/06/2020 Document Reviewed: 06/06/2020 Elsevier Patient Education  2024 [employer].

## 2024-02-01 LAB — SURGICAL PATHOLOGY

## 2024-02-05 ENCOUNTER — Ambulatory Visit: Payer: Self-pay | Admitting: Obstetrics
